# Patient Record
Sex: Female | Born: 1980 | Race: White | Hispanic: No | Marital: Married | Smoking: Never smoker
Health system: Southern US, Community
[De-identification: ages and names within clinical notes are randomized; demographics above are authoritative.]

## PROBLEM LIST (undated history)

## (undated) DIAGNOSIS — E786 Lipoprotein deficiency: Secondary | ICD-10-CM

## (undated) DIAGNOSIS — R7303 Prediabetes: Secondary | ICD-10-CM

## (undated) DIAGNOSIS — E559 Vitamin D deficiency, unspecified: Secondary | ICD-10-CM

## (undated) DIAGNOSIS — E7800 Pure hypercholesterolemia, unspecified: Secondary | ICD-10-CM

## (undated) DIAGNOSIS — R0602 Shortness of breath: Secondary | ICD-10-CM

## (undated) DIAGNOSIS — Z62819 Personal history of unspecified abuse in childhood: Secondary | ICD-10-CM

## (undated) DIAGNOSIS — M545 Low back pain, unspecified: Secondary | ICD-10-CM

## (undated) DIAGNOSIS — E538 Deficiency of other specified B group vitamins: Secondary | ICD-10-CM

## (undated) DIAGNOSIS — E162 Hypoglycemia, unspecified: Secondary | ICD-10-CM

## (undated) DIAGNOSIS — G4700 Insomnia, unspecified: Secondary | ICD-10-CM

## (undated) DIAGNOSIS — G43909 Migraine, unspecified, not intractable, without status migrainosus: Secondary | ICD-10-CM

## (undated) DIAGNOSIS — R61 Generalized hyperhidrosis: Secondary | ICD-10-CM

## (undated) DIAGNOSIS — E039 Hypothyroidism, unspecified: Secondary | ICD-10-CM

## (undated) DIAGNOSIS — M797 Fibromyalgia: Secondary | ICD-10-CM

## (undated) DIAGNOSIS — R1310 Dysphagia, unspecified: Secondary | ICD-10-CM

## (undated) DIAGNOSIS — N730 Acute parametritis and pelvic cellulitis: Secondary | ICD-10-CM

## (undated) DIAGNOSIS — N3289 Other specified disorders of bladder: Secondary | ICD-10-CM

## (undated) DIAGNOSIS — R079 Chest pain, unspecified: Secondary | ICD-10-CM

## (undated) DIAGNOSIS — R32 Unspecified urinary incontinence: Secondary | ICD-10-CM

## (undated) DIAGNOSIS — M62830 Muscle spasm of back: Secondary | ICD-10-CM

## (undated) DIAGNOSIS — F419 Anxiety disorder, unspecified: Secondary | ICD-10-CM

## (undated) DIAGNOSIS — R531 Weakness: Secondary | ICD-10-CM

## (undated) DIAGNOSIS — T7840XA Allergy, unspecified, initial encounter: Secondary | ICD-10-CM

## (undated) DIAGNOSIS — F319 Bipolar disorder, unspecified: Secondary | ICD-10-CM

## (undated) DIAGNOSIS — M2550 Pain in unspecified joint: Secondary | ICD-10-CM

## (undated) DIAGNOSIS — IMO0001 Reserved for inherently not codable concepts without codable children: Secondary | ICD-10-CM

## (undated) DIAGNOSIS — M549 Dorsalgia, unspecified: Secondary | ICD-10-CM

## (undated) DIAGNOSIS — M7989 Other specified soft tissue disorders: Secondary | ICD-10-CM

## (undated) DIAGNOSIS — R5382 Chronic fatigue, unspecified: Secondary | ICD-10-CM

## (undated) DIAGNOSIS — R202 Paresthesia of skin: Secondary | ICD-10-CM

## (undated) DIAGNOSIS — K760 Fatty (change of) liver, not elsewhere classified: Secondary | ICD-10-CM

## (undated) DIAGNOSIS — R51 Headache: Secondary | ICD-10-CM

## (undated) DIAGNOSIS — N979 Female infertility, unspecified: Secondary | ICD-10-CM

## (undated) DIAGNOSIS — G4733 Obstructive sleep apnea (adult) (pediatric): Secondary | ICD-10-CM

## (undated) DIAGNOSIS — M940 Chondrocostal junction syndrome [Tietze]: Secondary | ICD-10-CM

## (undated) DIAGNOSIS — K589 Irritable bowel syndrome without diarrhea: Secondary | ICD-10-CM

## (undated) DIAGNOSIS — N390 Urinary tract infection, site not specified: Secondary | ICD-10-CM

## (undated) DIAGNOSIS — G4730 Sleep apnea, unspecified: Secondary | ICD-10-CM

## (undated) DIAGNOSIS — K5900 Constipation, unspecified: Secondary | ICD-10-CM

## (undated) DIAGNOSIS — E079 Disorder of thyroid, unspecified: Secondary | ICD-10-CM

## (undated) DIAGNOSIS — R413 Other amnesia: Secondary | ICD-10-CM

## (undated) DIAGNOSIS — R5383 Other fatigue: Secondary | ICD-10-CM

## (undated) DIAGNOSIS — G9332 Myalgic encephalomyelitis/chronic fatigue syndrome: Secondary | ICD-10-CM

## (undated) DIAGNOSIS — E282 Polycystic ovarian syndrome: Secondary | ICD-10-CM

## (undated) DIAGNOSIS — F32A Depression, unspecified: Secondary | ICD-10-CM

## (undated) DIAGNOSIS — Z8739 Personal history of other diseases of the musculoskeletal system and connective tissue: Secondary | ICD-10-CM

## (undated) DIAGNOSIS — K219 Gastro-esophageal reflux disease without esophagitis: Secondary | ICD-10-CM

## (undated) HISTORY — DX: Insomnia, unspecified: G47.00

## (undated) HISTORY — DX: Polycystic ovarian syndrome: E28.2

## (undated) HISTORY — DX: Pure hypercholesterolemia, unspecified: E78.00

## (undated) HISTORY — DX: Fatty (change of) liver, not elsewhere classified: K76.0

## (undated) HISTORY — DX: Obstructive sleep apnea (adult) (pediatric): G47.33

## (undated) HISTORY — DX: Disorder of thyroid, unspecified: E07.9

## (undated) HISTORY — DX: Other amnesia: R41.3

## (undated) HISTORY — DX: Chronic fatigue, unspecified: R53.82

## (undated) HISTORY — DX: Prediabetes: R73.03

## (undated) HISTORY — DX: Chondrocostal junction syndrome (tietze): M94.0

## (undated) HISTORY — DX: Weakness: R53.1

## (undated) HISTORY — DX: Female infertility, unspecified: N97.9

## (undated) HISTORY — DX: Vitamin D deficiency, unspecified: E55.9

## (undated) HISTORY — DX: Muscle spasm of back: M62.830

## (undated) HISTORY — DX: Pain in unspecified joint: M25.50

## (undated) HISTORY — DX: Depression, unspecified: F32.A

## (undated) HISTORY — DX: Generalized hyperhidrosis: R61

## (undated) HISTORY — DX: Fibromyalgia: M79.7

## (undated) HISTORY — DX: Personal history of unspecified abuse in childhood: Z62.819

## (undated) HISTORY — DX: Headache: R51

## (undated) HISTORY — DX: Migraine, unspecified, not intractable, without status migrainosus: G43.909

## (undated) HISTORY — DX: Anxiety disorder, unspecified: F41.9

## (undated) HISTORY — DX: Constipation, unspecified: K59.00

## (undated) HISTORY — DX: Allergy, unspecified, initial encounter: T78.40XA

## (undated) HISTORY — DX: Deficiency of other specified B group vitamins: E53.8

## (undated) HISTORY — DX: Hypothyroidism, unspecified: E03.9

## (undated) HISTORY — DX: Hypoglycemia, unspecified: E16.2

## (undated) HISTORY — DX: Irritable bowel syndrome, unspecified: K58.9

## (undated) HISTORY — DX: Sleep apnea, unspecified: G47.30

## (undated) HISTORY — DX: Dysphagia, unspecified: R13.10

## (undated) HISTORY — DX: Other fatigue: R53.83

## (undated) HISTORY — DX: Chest pain, unspecified: R07.9

## (undated) HISTORY — DX: Dorsalgia, unspecified: M54.9

## (undated) HISTORY — DX: Lipoprotein deficiency: E78.6

## (undated) HISTORY — DX: Personal history of other diseases of the musculoskeletal system and connective tissue: Z87.39

## (undated) HISTORY — DX: Other specified soft tissue disorders: M79.89

## (undated) HISTORY — DX: Myalgic encephalomyelitis/chronic fatigue syndrome: G93.32

## (undated) HISTORY — DX: Gastro-esophageal reflux disease without esophagitis: K21.9

## (undated) HISTORY — DX: Other specified disorders of bladder: N32.89

## (undated) HISTORY — DX: Low back pain, unspecified: M54.50

## (undated) HISTORY — DX: Shortness of breath: R06.02

## (undated) HISTORY — DX: Paresthesia of skin: R20.2

## (undated) HISTORY — DX: Reserved for inherently not codable concepts without codable children: IMO0001

## (undated) HISTORY — DX: Low back pain: M54.5

## (undated) HISTORY — DX: Urinary tract infection, site not specified: N39.0

## (undated) HISTORY — DX: Bipolar disorder, unspecified: F31.9

## (undated) HISTORY — DX: Acute parametritis and pelvic cellulitis: N73.0

## (undated) HISTORY — DX: Unspecified urinary incontinence: R32

---
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
## 2006-12-28 ENCOUNTER — Emergency Department (HOSPITAL_COMMUNITY): Admission: EM | Admit: 2006-12-28 | Discharge: 2006-12-28 | Payer: Self-pay | Admitting: Emergency Medicine

## 2006-12-31 ENCOUNTER — Emergency Department (HOSPITAL_COMMUNITY): Admission: EM | Admit: 2006-12-31 | Discharge: 2006-12-31 | Payer: Self-pay | Admitting: Emergency Medicine

## 2007-01-20 ENCOUNTER — Emergency Department (HOSPITAL_COMMUNITY): Admission: EM | Admit: 2007-01-20 | Discharge: 2007-01-20 | Payer: Self-pay | Admitting: Emergency Medicine

## 2007-02-11 ENCOUNTER — Emergency Department (HOSPITAL_COMMUNITY): Admission: EM | Admit: 2007-02-11 | Discharge: 2007-02-11 | Payer: Self-pay | Admitting: Emergency Medicine

## 2007-02-23 ENCOUNTER — Ambulatory Visit (HOSPITAL_COMMUNITY): Admission: RE | Admit: 2007-02-23 | Discharge: 2007-02-23 | Payer: Self-pay | Admitting: Nurse Practitioner

## 2007-02-23 ENCOUNTER — Encounter (INDEPENDENT_AMBULATORY_CARE_PROVIDER_SITE_OTHER): Payer: Self-pay | Admitting: Nurse Practitioner

## 2007-02-23 ENCOUNTER — Ambulatory Visit: Payer: Self-pay | Admitting: Family Medicine

## 2007-02-23 LAB — CONVERTED CEMR LAB
ALT: 13 units/L (ref 0–35)
AST: 13 units/L (ref 0–37)
Alkaline Phosphatase: 63 units/L (ref 39–117)
BUN: 11 mg/dL (ref 6–23)
CO2: 21 meq/L (ref 19–32)
Creatinine, Ser: 0.74 mg/dL (ref 0.40–1.20)
Eosinophils Relative: 1 % (ref 0–5)
MCV: 90.9 fL (ref 78.0–100.0)
Monocytes Absolute: 0.6 10*3/uL (ref 0.2–0.7)
Platelets: 200 10*3/uL (ref 150–400)
Potassium: 4 meq/L (ref 3.5–5.3)
RDW: 15.2 % — ABNORMAL HIGH (ref 11.5–14.0)
Sodium: 140 meq/L (ref 135–145)
Total Bilirubin: 0.2 mg/dL — ABNORMAL LOW (ref 0.3–1.2)
Total Protein: 7.8 g/dL (ref 6.0–8.3)
WBC: 7.6 10*3/uL (ref 4.0–10.5)

## 2007-02-24 ENCOUNTER — Ambulatory Visit: Payer: Self-pay | Admitting: *Deleted

## 2007-03-02 ENCOUNTER — Emergency Department (HOSPITAL_COMMUNITY): Admission: EM | Admit: 2007-03-02 | Discharge: 2007-03-03 | Payer: Self-pay | Admitting: Emergency Medicine

## 2007-03-03 ENCOUNTER — Ambulatory Visit: Payer: Self-pay | Admitting: Internal Medicine

## 2007-03-03 LAB — CONVERTED CEMR LAB: TSH: 2.141 microintl units/mL (ref 0.350–5.50)

## 2007-03-07 ENCOUNTER — Emergency Department (HOSPITAL_COMMUNITY): Admission: EM | Admit: 2007-03-07 | Discharge: 2007-03-07 | Payer: Self-pay | Admitting: Emergency Medicine

## 2007-03-11 ENCOUNTER — Emergency Department (HOSPITAL_COMMUNITY): Admission: EM | Admit: 2007-03-11 | Discharge: 2007-03-11 | Payer: Self-pay | Admitting: *Deleted

## 2007-03-17 ENCOUNTER — Ambulatory Visit: Payer: Self-pay | Admitting: Family Medicine

## 2007-03-17 ENCOUNTER — Encounter (INDEPENDENT_AMBULATORY_CARE_PROVIDER_SITE_OTHER): Payer: Self-pay | Admitting: Nurse Practitioner

## 2007-03-18 ENCOUNTER — Encounter (INDEPENDENT_AMBULATORY_CARE_PROVIDER_SITE_OTHER): Payer: Self-pay | Admitting: Nurse Practitioner

## 2007-05-04 ENCOUNTER — Other Ambulatory Visit: Admission: RE | Admit: 2007-05-04 | Discharge: 2007-05-04 | Payer: Self-pay | Admitting: Obstetrics and Gynecology

## 2007-05-04 ENCOUNTER — Ambulatory Visit: Payer: Self-pay | Admitting: Obstetrics and Gynecology

## 2007-05-26 HISTORY — PX: MULTIPLE TOOTH EXTRACTIONS: SHX2053

## 2007-06-02 ENCOUNTER — Ambulatory Visit: Payer: Self-pay | Admitting: Obstetrics & Gynecology

## 2007-08-28 ENCOUNTER — Emergency Department (HOSPITAL_COMMUNITY): Admission: EM | Admit: 2007-08-28 | Discharge: 2007-08-28 | Payer: Self-pay | Admitting: Emergency Medicine

## 2007-10-10 ENCOUNTER — Emergency Department (HOSPITAL_COMMUNITY): Admission: EM | Admit: 2007-10-10 | Discharge: 2007-10-10 | Payer: Self-pay | Admitting: Emergency Medicine
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
## 2007-11-09 ENCOUNTER — Encounter: Admission: RE | Admit: 2007-11-09 | Discharge: 2007-11-09 | Payer: Self-pay | Admitting: Specialist

## 2008-07-27 ENCOUNTER — Emergency Department (HOSPITAL_COMMUNITY): Admission: EM | Admit: 2008-07-27 | Discharge: 2008-07-27 | Payer: Self-pay | Admitting: Emergency Medicine

## 2008-08-08 ENCOUNTER — Ambulatory Visit: Payer: Self-pay | Admitting: Psychiatry

## 2008-08-08 ENCOUNTER — Inpatient Hospital Stay (HOSPITAL_COMMUNITY): Admission: RE | Admit: 2008-08-08 | Discharge: 2008-08-10 | Payer: Self-pay | Admitting: Psychiatry

## 2009-02-24 ENCOUNTER — Emergency Department (HOSPITAL_COMMUNITY): Admission: EM | Admit: 2009-02-24 | Discharge: 2009-02-24 | Payer: Self-pay | Admitting: Family Medicine

## 2010-08-26 ENCOUNTER — Other Ambulatory Visit: Payer: Self-pay | Admitting: Family Medicine

## 2010-08-26 DIAGNOSIS — R7989 Other specified abnormal findings of blood chemistry: Secondary | ICD-10-CM

## 2010-08-29 ENCOUNTER — Ambulatory Visit
Admission: RE | Admit: 2010-08-29 | Discharge: 2010-08-29 | Disposition: A | Discharge status: Home / Self Care | Payer: 59 | Source: Ambulatory Visit | Attending: Family Medicine | Admitting: Family Medicine

## 2010-08-29 DIAGNOSIS — R7989 Other specified abnormal findings of blood chemistry: Secondary | ICD-10-CM

## 2010-08-29 MED ORDER — GADOBENATE DIMEGLUMINE 529 MG/ML IV SOLN
15.0000 mL | Freq: Once | INTRAVENOUS | Status: AC | PRN
  Administered 2010-08-29: 15 mL via INTRAVENOUS

## 2010-09-04 LAB — COMPREHENSIVE METABOLIC PANEL
ALT: 33 U/L (ref 0–35)
Alkaline Phosphatase: 66 U/L (ref 39–117)
BUN: 8 mg/dL (ref 6–23)
Calcium: 9.4 mg/dL (ref 8.4–10.5)
Chloride: 105 mEq/L (ref 96–112)
GFR calc non Af Amer: >60 mL/min (ref >60)
Sodium: 140 mEq/L (ref 135–145)
Total Bilirubin: 0.8 mg/dL (ref 0.3–1.2)

## 2010-09-04 LAB — CBC
HCT: 39.4 % (ref 36.0–46.0)
Hemoglobin: 13.3 g/dL (ref 12.0–15.0)
RBC: 4.52 MIL/uL (ref 3.87–5.11)

## 2010-09-04 LAB — URINE DRUGS OF ABUSE SCREEN W ALC, ROUTINE (REF LAB)
Amphetamine Screen, Ur: NEGATIVE
Barbiturate Quant, Ur: NEGATIVE
Cocaine Metabolites: NEGATIVE
Methadone: NEGATIVE
Opiate Screen, Urine: NEGATIVE
Propoxyphene: NEGATIVE

## 2010-10-07 NOTE — H&P (Signed)
Nancy [name], ION NO.:  [id number]   MEDICAL RECORD NO.:  [id number]          PATIENT TYPE:  IPS   LOCATION:  0508                          FACILITY:  [facility]   PHYSICIAN:  Geoffery Lyons, M.D.      DATE OF BIRTH:  March 12, 1981   DATE OF ADMISSION:  08/08/2008  DATE OF DISCHARGE:                       PSYCHIATRIC ADMISSION ASSESSMENT   A 30 year old female, voluntarily admitted August 08, 2008.   HISTORY OF PRESENT ILLNESS:  The patient reports a history of  depression, which she is finding very severe, experiencing mood swings,  having problems with no energy, decreased motivation.  Sleep has been  increased.  Appetite has been increased when she is especially having  low mood.  She states her mood changes daily with no specific stressors.  She has been having suicidal thoughts with plans to overdose or cut her  wrist.  She can identify no specific triggers, stating that she feels  like she has a good life.  She, in the past, has history of rages at  times.  She denies any psychotic symptoms and denies any history of any  substance use, has been compliant with her medications.   PAST PSYCHIATRIC HISTORY:  First admission to [facility].  Sees Dr. Ilsa Iha for outpatient mental health services and has a  therapist named Zenia Resides.  The patient reports being on Zoloft in the  past but feels it stopped working.  Also treated as PTSD.  She has tried  to harm herself three times the past by overdosing, once on pain pills  and two other polysubstance overdoses with one of the overdoses  requiring an inpatient admission.   SOCIAL HISTORY:  She is a married, female has been married since 2008.  She reports a supportive husband.  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  She has no children.   FAMILY HISTORY:  On her paternal side, mood disorders and a sister who  is bipolar.   ALCOHOL OR DRUG HISTORY:  Denies.   PRIMARY CARE  Reuven Braver:  Dr. Wynelle Link, who is her primary care Sharvi Mooneyhan.   MEDICAL PROBLEMS:  She reports history of occasional asthma symptoms.   MEDICATIONS:  1. Currently taking Lexapro 20 mg daily.  2. Vistaril 25 mg on a p.r.n. basis, but finds herself getting very      mean with the medications.   DRUG ALLERGIES:  She has no known allergies.   REVIEW OF SYSTEMS:  Positive for increased appetite with increased  weight gain, positive for insomnia, positive for mood changes and  occasional shortness of breath.  No chest pain.  No nausea, vomiting,  dysuria, no seizures.  No falls.  Positive for depression and suicidal  thoughts.   PHYSICAL EXAM:  VITAL SIGNS:  Temperature of 98.6, 77 heart rate, 18  respirations, blood pressure is 117/75, 5 feet 4 inches tall, 231  pounds.  GENERAL:  This is an obese female.  She is young, in no acute distress.  Denies any complaints.  HEENT:  Head is atraumatic.  EOMs are intact.  NECK:  Negative lymphadenopathy.  CHEST:  Clear, no wheezes.  No rales.  BREASTS:  Exam was deferred.  HEART:  Regular rate and rhythm.  No murmurs or gallops.  ABDOMEN:  Soft, nontender, nondistended abdomen.  PELVIC AND GU EXAM:  Deferred.  EXTREMITIES:  Moves all extremities.  No clubbing, no deformities.  Strong at 5+ against resistance.  SKIN:  Warm and dry.  No rashes.  The nursing skin assessment did show  some scratches from working outside along the upper chest and also  multiple nicks on the chest and arms.  NEUROLOGIC:  Findings are intact.  Cranial nerves II-XII are intact.   LABORATORY DATA:  Shows urine drug screen is negative.  CBC within  normal limits.  TSH is 4.080, glucose of 139.   MENTAL STATUS EXAM:  The patient is fully alert and cooperative,  casually dressed, good eye-contact.  Her speech is clear, normal pace  and tone, articulate.  She provides a good history on her symptoms,  knowledgeable about her medications.  Her thought processes are coherent   and goal directed.  No suicidal thoughts at this time.  Feels safe.  Her  cognitive function is intact.  Her memory is good.  Judgment is good.  Her insight appears to be good.  She appears sincere.   AXIS I:  Bipolar disorder, depressed.  AXIS II:  Deferred.  AXIS III:  Occasional respiratory symptoms.  AXIS IV:  Psychosocial problems with her burden of illness.  AXIS V:  Current is 35-40.   OUR PLAN:  To contract for safety.  We will discontinue her Lexapro and  initiate Lamictal as a mood stabilizer with the risks and benefits  discussed with the patient.  We will also augment with some Abilify at 2  mg on a b.i.d. basis, which was discussed with the patient.  The patient  is agreeable to her medication changes.  We will put the patient in the  blue group.  Will also consider family session with her husband for  support.  The patient will need to continue with her therapy sessions  and follow-up.  Tentative length of stay is 3-4 days.      Landry Corporal, N.P.      Geoffery Lyons, M.D.  Electronically Signed    JO/MEDQ  D:  08/10/2008  T:  08/10/2008  Job:  [id number]
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
## 2011-03-05 LAB — DIFFERENTIAL
Eosinophils Absolute: 0.1
Eosinophils Relative: 1
Eosinophils Relative: 1
Eosinophils Relative: 1
Lymphocytes Relative: 17
Lymphocytes Relative: 15
Lymphs Abs: 1.8
Lymphs Abs: 1.7
Monocytes Absolute: 0.5
Monocytes Absolute: 0.7
Monocytes Relative: 7
Monocytes Relative: 5
Monocytes Relative: 8
Neutro Abs: 7.4
Neutro Abs: 6.4

## 2011-03-05 LAB — RAPID URINE DRUG SCREEN, HOSP PERFORMED
Amphetamines: NOT DETECTED
Barbiturates: NOT DETECTED

## 2011-03-05 LAB — COMPREHENSIVE METABOLIC PANEL
ALT: 17
AST: 17
AST: 18
Albumin: 3.6
Albumin: 3.6
BUN: 8
Calcium: 9.1
Chloride: 102
Creatinine, Ser: 0.74
GFR calc Af Amer: >60
GFR calc Af Amer: >60
Potassium: 3.8
Sodium: 137
Total Protein: 7.9
Total Protein: 7.3

## 2011-03-05 LAB — CBC
HCT: 37.2
HCT: 38.9
Hemoglobin: 13.5
Hemoglobin: 13
MCHC: 34
MCV: 85.7
Platelets: 211
RBC: 4.55
RBC: 4.34
RDW: 14.2 — ABNORMAL HIGH
RDW: 14.5 — ABNORMAL HIGH
RDW: 14.4 — ABNORMAL HIGH
WBC: 10.1
WBC: 9.7
WBC: 8.5

## 2011-03-05 LAB — BASIC METABOLIC PANEL
Calcium: 9.4
GFR calc Af Amer: >60
GFR calc non Af Amer: >60
Glucose, Bld: 89
Potassium: 4
Sodium: 137

## 2011-03-05 LAB — ETHANOL: Alcohol, Ethyl (B): <5

## 2011-03-05 LAB — URINE CULTURE: Colony Count: >=100000

## 2011-03-05 LAB — URINALYSIS, ROUTINE W REFLEX MICROSCOPIC
Bilirubin Urine: NEGATIVE
Glucose, UA: NEGATIVE
Glucose, UA: NEGATIVE
Ketones, ur: NEGATIVE
Ketones, ur: NEGATIVE
Protein, ur: NEGATIVE
Specific Gravity, Urine: 1.027
Urobilinogen, UA: 0.2
pH: 6

## 2011-03-05 LAB — PREGNANCY, URINE: Preg Test, Ur: NEGATIVE

## 2011-03-05 LAB — POCT CARDIAC MARKERS
CKMB, poc: <1 — ABNORMAL LOW
Myoglobin, poc: 42.8
Troponin i, poc: <0.05

## 2011-03-06 LAB — URINALYSIS, ROUTINE W REFLEX MICROSCOPIC
Bilirubin Urine: NEGATIVE
Glucose, UA: NEGATIVE
Hgb urine dipstick: NEGATIVE
Specific Gravity, Urine: 1.027
pH: 7.5

## 2011-03-06 LAB — I-STAT 8, (EC8 V) (CONVERTED LAB)
BUN: 12
Bicarbonate: 26.2 — ABNORMAL HIGH
Glucose, Bld: 98
Hemoglobin: 15.3 — ABNORMAL HIGH
Sodium: 138
pH, Ven: 7.4 — ABNORMAL HIGH

## 2011-03-06 LAB — CBC
HCT: 41.7
Hemoglobin: 14.2
MCV: 86.7
RBC: 4.8
WBC: 8.6

## 2011-03-06 LAB — DIFFERENTIAL
Eosinophils Absolute: 0.1
Eosinophils Relative: 1
Lymphs Abs: 1.4
Monocytes Absolute: 0.5
Monocytes Relative: 6
Neutrophils Relative %: 78 — ABNORMAL HIGH

## 2011-03-06 LAB — POCT I-STAT CREATININE: Creatinine, Ser: 0.9

## 2011-03-06 LAB — POCT PREGNANCY, URINE: Preg Test, Ur: NEGATIVE

## 2011-03-06 LAB — D-DIMER, QUANTITATIVE: D-Dimer, Quant: 0.43

## 2011-03-09 LAB — URINALYSIS, ROUTINE W REFLEX MICROSCOPIC
Glucose, UA: NEGATIVE
Ketones, ur: 15 — AB
pH: 5.5

## 2011-03-09 LAB — URINE MICROSCOPIC-ADD ON

## 2011-03-09 LAB — I-STAT 8, (EC8 V) (CONVERTED LAB)
Acid-base deficit: 1
Chloride: 108
Hemoglobin: 13.9
Potassium: 3.6
Sodium: 143
TCO2: 25

## 2011-03-09 LAB — POCT I-STAT CREATININE
Creatinine, Ser: 0.9
Operator id: 198171

## 2011-03-09 LAB — D-DIMER, QUANTITATIVE: D-Dimer, Quant: 0.31

## 2011-08-31 ENCOUNTER — Encounter (HOSPITAL_COMMUNITY): Payer: Self-pay | Admitting: Psychiatry

## 2011-08-31 ENCOUNTER — Ambulatory Visit (INDEPENDENT_AMBULATORY_CARE_PROVIDER_SITE_OTHER): Payer: 59 | Admitting: Psychiatry

## 2011-08-31 VITALS — BP 132/75 | HR 61 | Ht 64.0 in | Wt 235.0 lb

## 2011-08-31 DIAGNOSIS — F319 Bipolar disorder, unspecified: Secondary | ICD-10-CM
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
## 2011-09-30 ENCOUNTER — Ambulatory Visit (HOSPITAL_COMMUNITY): Payer: 59 | Admitting: Psychiatry

## 2011-11-04 ENCOUNTER — Other Ambulatory Visit: Payer: Self-pay | Admitting: Family Medicine

## 2011-11-04 ENCOUNTER — Ambulatory Visit
Admission: RE | Admit: 2011-11-04 | Discharge: 2011-11-04 | Disposition: A | Discharge status: Home / Self Care | Payer: 59 | Source: Ambulatory Visit | Attending: Family Medicine | Admitting: Family Medicine

## 2011-11-04 DIAGNOSIS — M25569 Pain in unspecified knee: Secondary | ICD-10-CM

## 2012-04-15 ENCOUNTER — Other Ambulatory Visit: Payer: Self-pay | Admitting: Rheumatology

## 2012-04-15 ENCOUNTER — Ambulatory Visit
Admission: RE | Admit: 2012-04-15 | Discharge: 2012-04-15 | Disposition: A | Discharge status: Home / Self Care | Payer: 59 | Source: Ambulatory Visit | Attending: Rheumatology | Admitting: Rheumatology

## 2012-04-15 DIAGNOSIS — R0602 Shortness of breath: Secondary | ICD-10-CM

## 2012-04-15 DIAGNOSIS — M2550 Pain in unspecified joint: Secondary | ICD-10-CM

## 2012-05-10 ENCOUNTER — Other Ambulatory Visit: Payer: Self-pay | Admitting: Rheumatology

## 2012-05-10 DIAGNOSIS — M79642 Pain in left hand: Secondary | ICD-10-CM

## 2012-05-10 DIAGNOSIS — M25532 Pain in left wrist: Secondary | ICD-10-CM

## 2012-05-16 ENCOUNTER — Other Ambulatory Visit: Payer: 59

## 2012-05-16 ENCOUNTER — Ambulatory Visit
Admission: RE | Admit: 2012-05-16 | Discharge: 2012-05-16 | Disposition: A | Discharge status: Home / Self Care | Payer: 59 | Source: Ambulatory Visit | Attending: Rheumatology | Admitting: Rheumatology

## 2012-05-16 DIAGNOSIS — M79642 Pain in left hand: Secondary | ICD-10-CM

## 2012-05-16 DIAGNOSIS — M25532 Pain in left wrist: Secondary | ICD-10-CM

## 2012-05-16 MED ORDER — GADOBENATE DIMEGLUMINE 529 MG/ML IV SOLN
20.0000 mL | Freq: Once | INTRAVENOUS | Status: AC | PRN
  Administered 2012-05-16: 20 mL via INTRAVENOUS

## 2012-10-21 ENCOUNTER — Other Ambulatory Visit (HOSPITAL_COMMUNITY)
Admission: RE | Admit: 2012-10-21 | Discharge: 2012-10-21 | Disposition: A | Discharge status: Home / Self Care | Payer: 59 | Source: Ambulatory Visit | Attending: Family Medicine | Admitting: Family Medicine

## 2012-10-21 ENCOUNTER — Other Ambulatory Visit: Payer: Self-pay | Admitting: Family Medicine

## 2012-10-21 DIAGNOSIS — Z01419 Encounter for gynecological examination (general) (routine) without abnormal findings: Secondary | ICD-10-CM | POA: Insufficient documentation

## 2012-12-28 ENCOUNTER — Other Ambulatory Visit: Payer: Self-pay | Admitting: Gastroenterology

## 2012-12-28 DIAGNOSIS — R0789 Other chest pain: Secondary | ICD-10-CM

## 2012-12-30 ENCOUNTER — Ambulatory Visit
Admission: RE | Admit: 2012-12-30 | Discharge: 2012-12-30 | Disposition: A | Discharge status: Home / Self Care | Payer: 59 | Source: Ambulatory Visit | Attending: Gastroenterology | Admitting: Gastroenterology

## 2012-12-30 ENCOUNTER — Other Ambulatory Visit: Payer: 59

## 2012-12-30 DIAGNOSIS — R0789 Other chest pain: Secondary | ICD-10-CM

## 2013-01-03 ENCOUNTER — Other Ambulatory Visit: Payer: Self-pay | Admitting: Gastroenterology

## 2013-01-03 DIAGNOSIS — R109 Unspecified abdominal pain: Secondary | ICD-10-CM

## 2013-01-05 ENCOUNTER — Ambulatory Visit
Admission: RE | Admit: 2013-01-05 | Discharge: 2013-01-05 | Disposition: A | Discharge status: Home / Self Care | Payer: 59 | Source: Ambulatory Visit | Attending: Gastroenterology | Admitting: Gastroenterology

## 2013-01-05 DIAGNOSIS — R109 Unspecified abdominal pain: Secondary | ICD-10-CM

## 2013-01-05 MED ORDER — IOHEXOL 300 MG/ML  SOLN
125.0000 mL | Freq: Once | INTRAMUSCULAR | Status: AC | PRN
  Administered 2013-01-05: 125 mL via INTRAVENOUS

## 2013-01-05 MED ORDER — IOHEXOL 300 MG/ML  SOLN
30.0000 mL | Freq: Once | INTRAMUSCULAR | Status: AC | PRN
  Administered 2013-01-05: 30 mL via ORAL

## 2013-07-28 ENCOUNTER — Encounter: Payer: Self-pay | Admitting: *Deleted

## 2013-08-04 ENCOUNTER — Ambulatory Visit: Payer: 59 | Admitting: Neurology

## 2013-11-09 ENCOUNTER — Encounter: Payer: Self-pay | Admitting: *Deleted

## 2014-12-28 ENCOUNTER — Other Ambulatory Visit: Payer: Self-pay | Admitting: Family Medicine

## 2014-12-28 ENCOUNTER — Other Ambulatory Visit (HOSPITAL_COMMUNITY)
Admission: RE | Admit: 2014-12-28 | Discharge: 2014-12-28 | Disposition: A | Discharge status: Home / Self Care | Payer: No Typology Code available for payment source | Source: Ambulatory Visit | Attending: Family Medicine | Admitting: Family Medicine

## 2014-12-28 DIAGNOSIS — Z01411 Encounter for gynecological examination (general) (routine) with abnormal findings: Secondary | ICD-10-CM | POA: Insufficient documentation

## 2014-12-31 LAB — CYTOLOGY - PAP

## 2016-07-20 ENCOUNTER — Other Ambulatory Visit (HOSPITAL_COMMUNITY)
Admission: RE | Admit: 2016-07-20 | Discharge: 2016-07-20 | Disposition: A | Discharge status: Home / Self Care | Payer: BLUE CROSS/BLUE SHIELD | Source: Ambulatory Visit | Attending: Family Medicine | Admitting: Family Medicine

## 2016-07-20 ENCOUNTER — Other Ambulatory Visit: Payer: Self-pay | Admitting: Family Medicine

## 2016-07-20 DIAGNOSIS — Z01411 Encounter for gynecological examination (general) (routine) with abnormal findings: Secondary | ICD-10-CM | POA: Insufficient documentation

## 2016-07-22 LAB — CYTOLOGY - PAP: DIAGNOSIS: NEGATIVE

## 2017-03-02 ENCOUNTER — Other Ambulatory Visit: Payer: Self-pay | Admitting: Family Medicine

## 2017-03-02 DIAGNOSIS — R202 Paresthesia of skin: Secondary | ICD-10-CM

## 2017-03-02 DIAGNOSIS — M542 Cervicalgia: Secondary | ICD-10-CM

## 2017-03-12 ENCOUNTER — Ambulatory Visit
Admission: RE | Admit: 2017-03-12 | Discharge: 2017-03-12 | Disposition: A | Discharge status: Home / Self Care | Payer: BLUE CROSS/BLUE SHIELD | Source: Ambulatory Visit | Attending: Family Medicine | Admitting: Family Medicine

## 2017-03-12 DIAGNOSIS — R202 Paresthesia of skin: Secondary | ICD-10-CM

## 2017-03-12 DIAGNOSIS — M542 Cervicalgia: Secondary | ICD-10-CM

## 2017-03-25 HISTORY — PX: NECK SURGERY: SHX720

## 2018-02-01 ENCOUNTER — Encounter: Payer: Self-pay | Admitting: *Deleted

## 2018-02-02 ENCOUNTER — Encounter

## 2018-02-02 ENCOUNTER — Telehealth: Payer: Self-pay | Admitting: Neurology

## 2018-02-02 ENCOUNTER — Encounter: Payer: Self-pay | Admitting: Neurology

## 2018-02-02 ENCOUNTER — Ambulatory Visit (INDEPENDENT_AMBULATORY_CARE_PROVIDER_SITE_OTHER): Payer: BLUE CROSS/BLUE SHIELD | Admitting: Neurology

## 2018-02-02 VITALS — BP 134/83 | HR 84 | Ht 64.0 in | Wt 239.8 lb

## 2018-02-02 DIAGNOSIS — M791 Myalgia, unspecified site: Secondary | ICD-10-CM | POA: Diagnosis not present

## 2018-02-02 DIAGNOSIS — G3281 Cerebellar ataxia in diseases classified elsewhere: Secondary | ICD-10-CM | POA: Diagnosis not present

## 2018-02-02 DIAGNOSIS — R413 Other amnesia: Secondary | ICD-10-CM

## 2018-02-02 DIAGNOSIS — R269 Unspecified abnormalities of gait and mobility: Secondary | ICD-10-CM

## 2018-02-02 NOTE — Telephone Encounter (Signed)
[employer] Auth: [id number] (exp. 02/02/18 to 03/03/18) order sent to [facility]. They will reach out to the pt to schedule.
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
## 2018-02-03 ENCOUNTER — Telehealth: Payer: Self-pay | Admitting: Neurology

## 2018-02-03 NOTE — Telephone Encounter (Signed)
The order was sent to [facility] she is scheduled for 02/08/18 at [facility].

## 2018-02-03 NOTE — Telephone Encounter (Signed)
Pt rec'd a call to schedule MRI of cervical. She said Dr Krista Blue was to put in order for MRI brain also. Please call to advise

## 2018-02-04 ENCOUNTER — Telehealth: Payer: Self-pay | Admitting: Neurology

## 2018-02-04 LAB — VITAMIN B12: Vitamin B-12: 893 pg/mL (ref 232–1245)

## 2018-02-04 LAB — C-REACTIVE PROTEIN: CRP: 7 mg/L (ref 0–10)

## 2018-02-04 LAB — COPPER, SERUM: Copper: 155 ug/dL (ref 72–166)

## 2018-02-04 LAB — RPR: RPR Ser Ql: NONREACTIVE

## 2018-02-04 LAB — SEDIMENTATION RATE: Sed Rate: 13 mm/hr (ref 0–32)

## 2018-02-04 LAB — ANA W/REFLEX: Anti Nuclear Antibody(ANA): NEGATIVE

## 2018-02-04 LAB — ACETYLCHOLINE RECEPTOR, BINDING: AChR Binding Ab, Serum: <0.03 nmol/L (ref 0.00–0.24)

## 2018-02-04 LAB — CK: Total CK: 241 U/L — ABNORMAL HIGH (ref 24–173)

## 2018-02-04 NOTE — Telephone Encounter (Signed)
Spoke with pt. She sts. she thought Dr. Krista Blue was also going to order an MRI brain, due to increased problems with memory.  I will check with Dr. Krista Blue and call pt. back/fim

## 2018-02-04 NOTE — Telephone Encounter (Signed)
Pt has called stating that she called for Dr Krista Blue on yesterday and has not heard back yet.  Pt is asking for a call back please

## 2018-02-04 NOTE — Telephone Encounter (Signed)
Spoke with pt. and reviewed below lab results. She verbalized understanding of same/fim 

## 2018-02-04 NOTE — Telephone Encounter (Signed)
Yes, MRI brain and cervical orders were placed

## 2018-02-04 NOTE — Telephone Encounter (Signed)
Please call patient, extensive laboratory evaluation showed mild elevated CPK 241, which has unknown clinical significance, rest of the laboratory evaluations were normal,

## 2018-02-07 NOTE — Telephone Encounter (Signed)
Noted  

## 2018-02-07 NOTE — Telephone Encounter (Signed)
[employer] Auth: [id number] (exp. 02/07/18 to 03/08/18) order sent to GI.

## 2018-02-07 NOTE — Telephone Encounter (Signed)
LMOM that Dr. Krista Blue added the MRI brain, and I will check with Raquel Sarna to see if both of these can be done at the same time./fim

## 2018-02-08 ENCOUNTER — Other Ambulatory Visit: Payer: BLUE CROSS/BLUE SHIELD

## 2018-02-17 ENCOUNTER — Ambulatory Visit
Admission: RE | Admit: 2018-02-17 | Discharge: 2018-02-17 | Disposition: A | Discharge status: Home / Self Care | Payer: BLUE CROSS/BLUE SHIELD | Source: Ambulatory Visit | Attending: Neurology | Admitting: Neurology

## 2018-02-17 DIAGNOSIS — R413 Other amnesia: Secondary | ICD-10-CM

## 2018-02-17 DIAGNOSIS — G3281 Cerebellar ataxia in diseases classified elsewhere: Secondary | ICD-10-CM

## 2018-03-22 ENCOUNTER — Encounter (INDEPENDENT_AMBULATORY_CARE_PROVIDER_SITE_OTHER): Payer: BLUE CROSS/BLUE SHIELD

## 2018-03-25 ENCOUNTER — Encounter (INDEPENDENT_AMBULATORY_CARE_PROVIDER_SITE_OTHER): Payer: Self-pay | Admitting: Bariatrics

## 2018-03-28 ENCOUNTER — Encounter (INDEPENDENT_AMBULATORY_CARE_PROVIDER_SITE_OTHER): Payer: Self-pay | Admitting: Bariatrics

## 2018-03-29 ENCOUNTER — Encounter (INDEPENDENT_AMBULATORY_CARE_PROVIDER_SITE_OTHER): Payer: Self-pay

## 2018-03-29 ENCOUNTER — Ambulatory Visit (INDEPENDENT_AMBULATORY_CARE_PROVIDER_SITE_OTHER): Payer: BLUE CROSS/BLUE SHIELD | Admitting: Bariatrics

## 2018-04-12 ENCOUNTER — Ambulatory Visit (INDEPENDENT_AMBULATORY_CARE_PROVIDER_SITE_OTHER): Payer: BLUE CROSS/BLUE SHIELD | Admitting: Bariatrics

## 2018-04-27 ENCOUNTER — Ambulatory Visit (INDEPENDENT_AMBULATORY_CARE_PROVIDER_SITE_OTHER): Payer: BLUE CROSS/BLUE SHIELD | Admitting: Physician Assistant

## 2018-07-06 ENCOUNTER — Ambulatory Visit (INDEPENDENT_AMBULATORY_CARE_PROVIDER_SITE_OTHER): Payer: BLUE CROSS/BLUE SHIELD | Admitting: Neurology

## 2018-07-06 ENCOUNTER — Encounter: Payer: Self-pay | Admitting: Neurology

## 2018-07-06 VITALS — BP 130/90 | HR 86 | Ht 64.0 in | Wt 242.0 lb

## 2018-07-06 DIAGNOSIS — M791 Myalgia, unspecified site: Secondary | ICD-10-CM | POA: Diagnosis not present

## 2018-07-06 DIAGNOSIS — R404 Transient alteration of awareness: Secondary | ICD-10-CM | POA: Insufficient documentation
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
## 2018-07-14 ENCOUNTER — Ambulatory Visit (INDEPENDENT_AMBULATORY_CARE_PROVIDER_SITE_OTHER): Payer: BLUE CROSS/BLUE SHIELD

## 2018-07-14 DIAGNOSIS — J309 Allergic rhinitis, unspecified: Secondary | ICD-10-CM | POA: Diagnosis not present

## 2018-08-08 ENCOUNTER — Other Ambulatory Visit: Payer: BLUE CROSS/BLUE SHIELD

## 2018-09-12 ENCOUNTER — Other Ambulatory Visit: Payer: Self-pay

## 2018-12-26 DIAGNOSIS — Z79899 Other long term (current) drug therapy: Secondary | ICD-10-CM | POA: Diagnosis not present

## 2018-12-26 DIAGNOSIS — M545 Low back pain: Secondary | ICD-10-CM | POA: Diagnosis not present

## 2018-12-26 DIAGNOSIS — G894 Chronic pain syndrome: Secondary | ICD-10-CM | POA: Diagnosis not present

## 2018-12-28 DIAGNOSIS — E559 Vitamin D deficiency, unspecified: Secondary | ICD-10-CM | POA: Diagnosis not present

## 2018-12-28 DIAGNOSIS — Z79899 Other long term (current) drug therapy: Secondary | ICD-10-CM | POA: Diagnosis not present

## 2018-12-28 DIAGNOSIS — E538 Deficiency of other specified B group vitamins: Secondary | ICD-10-CM | POA: Diagnosis not present

## 2018-12-28 DIAGNOSIS — Z131 Encounter for screening for diabetes mellitus: Secondary | ICD-10-CM | POA: Diagnosis not present

## 2018-12-28 DIAGNOSIS — N946 Dysmenorrhea, unspecified: Secondary | ICD-10-CM | POA: Diagnosis not present

## 2018-12-28 DIAGNOSIS — E039 Hypothyroidism, unspecified: Secondary | ICD-10-CM | POA: Diagnosis not present

## 2018-12-28 DIAGNOSIS — E7800 Pure hypercholesterolemia, unspecified: Secondary | ICD-10-CM | POA: Diagnosis not present

## 2019-01-09 ENCOUNTER — Other Ambulatory Visit: Payer: Self-pay

## 2019-01-25 DIAGNOSIS — Z79899 Other long term (current) drug therapy: Secondary | ICD-10-CM | POA: Diagnosis not present

## 2019-01-25 DIAGNOSIS — G894 Chronic pain syndrome: Secondary | ICD-10-CM | POA: Diagnosis not present

## 2019-01-25 DIAGNOSIS — M5136 Other intervertebral disc degeneration, lumbar region: Secondary | ICD-10-CM | POA: Diagnosis not present

## 2019-01-25 DIAGNOSIS — Z760 Encounter for issue of repeat prescription: Secondary | ICD-10-CM | POA: Diagnosis not present

## 2019-01-26 DIAGNOSIS — E7800 Pure hypercholesterolemia, unspecified: Secondary | ICD-10-CM | POA: Diagnosis not present

## 2019-01-26 DIAGNOSIS — E559 Vitamin D deficiency, unspecified: Secondary | ICD-10-CM | POA: Diagnosis not present

## 2019-01-26 DIAGNOSIS — E538 Deficiency of other specified B group vitamins: Secondary | ICD-10-CM | POA: Diagnosis not present

## 2019-01-26 DIAGNOSIS — E039 Hypothyroidism, unspecified: Secondary | ICD-10-CM | POA: Diagnosis not present

## 2019-02-23 DIAGNOSIS — N390 Urinary tract infection, site not specified: Secondary | ICD-10-CM | POA: Diagnosis not present

## 2019-02-23 DIAGNOSIS — F5081 Binge eating disorder: Secondary | ICD-10-CM | POA: Diagnosis not present

## 2019-02-23 DIAGNOSIS — R300 Dysuria: Secondary | ICD-10-CM | POA: Diagnosis not present

## 2019-02-24 DIAGNOSIS — Z79899 Other long term (current) drug therapy: Secondary | ICD-10-CM | POA: Diagnosis not present

## 2019-02-24 DIAGNOSIS — G894 Chronic pain syndrome: Secondary | ICD-10-CM | POA: Diagnosis not present

## 2019-02-24 DIAGNOSIS — M545 Low back pain: Secondary | ICD-10-CM | POA: Diagnosis not present

## 2019-03-14 DIAGNOSIS — F3162 Bipolar disorder, current episode mixed, moderate: Secondary | ICD-10-CM | POA: Diagnosis not present

## 2019-03-20 DIAGNOSIS — N3941 Urge incontinence: Secondary | ICD-10-CM | POA: Diagnosis not present

## 2019-03-27 DIAGNOSIS — G894 Chronic pain syndrome: Secondary | ICD-10-CM | POA: Diagnosis not present

## 2019-03-27 DIAGNOSIS — M5136 Other intervertebral disc degeneration, lumbar region: Secondary | ICD-10-CM | POA: Diagnosis not present

## 2019-03-27 DIAGNOSIS — Z79899 Other long term (current) drug therapy: Secondary | ICD-10-CM | POA: Diagnosis not present

## 2019-04-10 DIAGNOSIS — N390 Urinary tract infection, site not specified: Secondary | ICD-10-CM | POA: Diagnosis not present

## 2019-04-24 DIAGNOSIS — E7800 Pure hypercholesterolemia, unspecified: Secondary | ICD-10-CM | POA: Diagnosis not present

## 2019-04-24 DIAGNOSIS — E039 Hypothyroidism, unspecified: Secondary | ICD-10-CM | POA: Diagnosis not present

## 2019-04-24 DIAGNOSIS — R7303 Prediabetes: Secondary | ICD-10-CM | POA: Diagnosis not present

## 2019-04-24 DIAGNOSIS — E559 Vitamin D deficiency, unspecified: Secondary | ICD-10-CM | POA: Diagnosis not present

## 2019-04-25 DIAGNOSIS — Z79899 Other long term (current) drug therapy: Secondary | ICD-10-CM | POA: Diagnosis not present

## 2019-04-25 DIAGNOSIS — M5136 Other intervertebral disc degeneration, lumbar region: Secondary | ICD-10-CM | POA: Diagnosis not present

## 2019-04-25 DIAGNOSIS — G894 Chronic pain syndrome: Secondary | ICD-10-CM | POA: Diagnosis not present

## 2019-05-22 DIAGNOSIS — N390 Urinary tract infection, site not specified: Secondary | ICD-10-CM | POA: Diagnosis not present

## 2019-05-22 DIAGNOSIS — R319 Hematuria, unspecified: Secondary | ICD-10-CM | POA: Diagnosis not present

## 2019-05-24 DIAGNOSIS — Z9189 Other specified personal risk factors, not elsewhere classified: Secondary | ICD-10-CM | POA: Diagnosis not present

## 2019-05-24 DIAGNOSIS — E039 Hypothyroidism, unspecified: Secondary | ICD-10-CM | POA: Diagnosis not present

## 2019-05-25 DIAGNOSIS — G894 Chronic pain syndrome: Secondary | ICD-10-CM | POA: Diagnosis not present

## 2019-05-25 DIAGNOSIS — Z79899 Other long term (current) drug therapy: Secondary | ICD-10-CM | POA: Diagnosis not present

## 2019-05-25 DIAGNOSIS — M545 Low back pain: Secondary | ICD-10-CM | POA: Diagnosis not present

## 2019-06-02 DIAGNOSIS — N390 Urinary tract infection, site not specified: Secondary | ICD-10-CM | POA: Diagnosis not present

## 2019-06-13 DIAGNOSIS — F3162 Bipolar disorder, current episode mixed, moderate: Secondary | ICD-10-CM | POA: Diagnosis not present

## 2019-06-22 DIAGNOSIS — R635 Abnormal weight gain: Secondary | ICD-10-CM | POA: Diagnosis not present

## 2019-06-22 DIAGNOSIS — R5383 Other fatigue: Secondary | ICD-10-CM | POA: Diagnosis not present

## 2019-06-23 DIAGNOSIS — M5136 Other intervertebral disc degeneration, lumbar region: Secondary | ICD-10-CM | POA: Diagnosis not present

## 2019-06-23 DIAGNOSIS — Z79899 Other long term (current) drug therapy: Secondary | ICD-10-CM | POA: Diagnosis not present

## 2019-06-23 DIAGNOSIS — G894 Chronic pain syndrome: Secondary | ICD-10-CM | POA: Diagnosis not present

## 2019-06-29 DIAGNOSIS — H52223 Regular astigmatism, bilateral: Secondary | ICD-10-CM | POA: Diagnosis not present

## 2019-06-29 DIAGNOSIS — E7800 Pure hypercholesterolemia, unspecified: Secondary | ICD-10-CM | POA: Diagnosis not present

## 2019-06-29 DIAGNOSIS — Z0100 Encounter for examination of eyes and vision without abnormal findings: Secondary | ICD-10-CM | POA: Diagnosis not present

## 2019-06-30 DIAGNOSIS — N390 Urinary tract infection, site not specified: Secondary | ICD-10-CM | POA: Diagnosis not present

## 2019-06-30 DIAGNOSIS — B951 Streptococcus, group B, as the cause of diseases classified elsewhere: Secondary | ICD-10-CM | POA: Diagnosis not present

## 2019-06-30 DIAGNOSIS — R8279 Other abnormal findings on microbiological examination of urine: Secondary | ICD-10-CM | POA: Diagnosis not present

## 2019-07-19 DIAGNOSIS — E538 Deficiency of other specified B group vitamins: Secondary | ICD-10-CM | POA: Diagnosis not present

## 2019-07-19 DIAGNOSIS — Z79899 Other long term (current) drug therapy: Secondary | ICD-10-CM | POA: Diagnosis not present

## 2019-07-19 DIAGNOSIS — E559 Vitamin D deficiency, unspecified: Secondary | ICD-10-CM | POA: Diagnosis not present

## 2019-07-19 DIAGNOSIS — E282 Polycystic ovarian syndrome: Secondary | ICD-10-CM | POA: Diagnosis not present

## 2019-07-19 DIAGNOSIS — E7800 Pure hypercholesterolemia, unspecified: Secondary | ICD-10-CM | POA: Diagnosis not present

## 2019-07-19 DIAGNOSIS — E039 Hypothyroidism, unspecified: Secondary | ICD-10-CM | POA: Diagnosis not present

## 2019-07-24 DIAGNOSIS — M545 Low back pain: Secondary | ICD-10-CM | POA: Diagnosis not present

## 2019-07-24 DIAGNOSIS — Z79899 Other long term (current) drug therapy: Secondary | ICD-10-CM | POA: Diagnosis not present

## 2019-07-24 DIAGNOSIS — G894 Chronic pain syndrome: Secondary | ICD-10-CM | POA: Diagnosis not present

## 2019-08-21 ENCOUNTER — Other Ambulatory Visit (HOSPITAL_COMMUNITY)
Admission: RE | Admit: 2019-08-21 | Discharge: 2019-08-21 | Disposition: A | Discharge status: Home / Self Care | Payer: Medicare HMO | Source: Ambulatory Visit | Attending: Family Medicine | Admitting: Family Medicine

## 2019-08-21 ENCOUNTER — Other Ambulatory Visit: Payer: Self-pay | Admitting: Family Medicine

## 2019-08-21 DIAGNOSIS — R87810 Cervical high risk human papillomavirus (HPV) DNA test positive: Secondary | ICD-10-CM | POA: Insufficient documentation

## 2019-08-21 DIAGNOSIS — F319 Bipolar disorder, unspecified: Secondary | ICD-10-CM | POA: Diagnosis not present

## 2019-08-21 DIAGNOSIS — R7309 Other abnormal glucose: Secondary | ICD-10-CM | POA: Diagnosis not present

## 2019-08-21 DIAGNOSIS — E039 Hypothyroidism, unspecified: Secondary | ICD-10-CM | POA: Diagnosis not present

## 2019-08-21 DIAGNOSIS — Z1151 Encounter for screening for human papillomavirus (HPV): Secondary | ICD-10-CM | POA: Insufficient documentation

## 2019-08-21 DIAGNOSIS — Z01411 Encounter for gynecological examination (general) (routine) with abnormal findings: Secondary | ICD-10-CM | POA: Diagnosis not present

## 2019-08-21 DIAGNOSIS — M797 Fibromyalgia: Secondary | ICD-10-CM | POA: Diagnosis not present

## 2019-08-21 DIAGNOSIS — R87610 Atypical squamous cells of undetermined significance on cytologic smear of cervix (ASC-US): Secondary | ICD-10-CM | POA: Diagnosis not present

## 2019-08-21 DIAGNOSIS — E7800 Pure hypercholesterolemia, unspecified: Secondary | ICD-10-CM | POA: Diagnosis not present

## 2019-08-21 DIAGNOSIS — Z0000 Encounter for general adult medical examination without abnormal findings: Secondary | ICD-10-CM | POA: Diagnosis not present

## 2019-08-21 DIAGNOSIS — E282 Polycystic ovarian syndrome: Secondary | ICD-10-CM | POA: Diagnosis not present

## 2019-08-21 DIAGNOSIS — Z01419 Encounter for gynecological examination (general) (routine) without abnormal findings: Secondary | ICD-10-CM | POA: Diagnosis present

## 2019-08-22 DIAGNOSIS — M545 Low back pain: Secondary | ICD-10-CM | POA: Diagnosis not present

## 2019-08-22 DIAGNOSIS — F1120 Opioid dependence, uncomplicated: Secondary | ICD-10-CM | POA: Diagnosis not present

## 2019-08-22 DIAGNOSIS — G894 Chronic pain syndrome: Secondary | ICD-10-CM | POA: Diagnosis not present

## 2019-08-22 DIAGNOSIS — Z79899 Other long term (current) drug therapy: Secondary | ICD-10-CM | POA: Diagnosis not present

## 2019-08-23 LAB — CYTOLOGY - PAP
Comment: NEGATIVE
Diagnosis: UNDETERMINED — AB
High risk HPV: POSITIVE — AB

## 2019-09-14 DIAGNOSIS — E282 Polycystic ovarian syndrome: Secondary | ICD-10-CM | POA: Diagnosis not present

## 2019-09-14 DIAGNOSIS — M25522 Pain in left elbow: Secondary | ICD-10-CM | POA: Diagnosis not present

## 2019-09-15 DIAGNOSIS — R87610 Atypical squamous cells of undetermined significance on cytologic smear of cervix (ASC-US): Secondary | ICD-10-CM | POA: Diagnosis not present

## 2019-09-15 DIAGNOSIS — R87810 Cervical high risk human papillomavirus (HPV) DNA test positive: Secondary | ICD-10-CM | POA: Diagnosis not present

## 2019-09-20 DIAGNOSIS — Z79899 Other long term (current) drug therapy: Secondary | ICD-10-CM | POA: Diagnosis not present

## 2019-09-20 DIAGNOSIS — M5136 Other intervertebral disc degeneration, lumbar region: Secondary | ICD-10-CM | POA: Diagnosis not present

## 2019-09-20 DIAGNOSIS — G894 Chronic pain syndrome: Secondary | ICD-10-CM | POA: Diagnosis not present

## 2019-09-25 DIAGNOSIS — N3020 Other chronic cystitis without hematuria: Secondary | ICD-10-CM | POA: Diagnosis not present

## 2019-09-25 DIAGNOSIS — R350 Frequency of micturition: Secondary | ICD-10-CM | POA: Diagnosis not present

## 2019-09-25 DIAGNOSIS — R102 Pelvic and perineal pain: Secondary | ICD-10-CM | POA: Diagnosis not present

## 2019-10-10 DIAGNOSIS — N3941 Urge incontinence: Secondary | ICD-10-CM | POA: Diagnosis not present

## 2019-10-10 DIAGNOSIS — R3915 Urgency of urination: Secondary | ICD-10-CM | POA: Diagnosis not present

## 2019-10-10 DIAGNOSIS — M62838 Other muscle spasm: Secondary | ICD-10-CM | POA: Diagnosis not present

## 2019-10-10 DIAGNOSIS — K5900 Constipation, unspecified: Secondary | ICD-10-CM | POA: Diagnosis not present

## 2019-10-10 DIAGNOSIS — R350 Frequency of micturition: Secondary | ICD-10-CM | POA: Diagnosis not present

## 2019-10-10 DIAGNOSIS — M6281 Muscle weakness (generalized): Secondary | ICD-10-CM | POA: Diagnosis not present

## 2019-10-10 DIAGNOSIS — M6289 Other specified disorders of muscle: Secondary | ICD-10-CM | POA: Diagnosis not present

## 2019-10-12 DIAGNOSIS — E282 Polycystic ovarian syndrome: Secondary | ICD-10-CM | POA: Diagnosis not present

## 2019-10-12 DIAGNOSIS — M25569 Pain in unspecified knee: Secondary | ICD-10-CM | POA: Diagnosis not present

## 2019-10-12 DIAGNOSIS — R5383 Other fatigue: Secondary | ICD-10-CM | POA: Diagnosis not present

## 2019-10-16 DIAGNOSIS — E7800 Pure hypercholesterolemia, unspecified: Secondary | ICD-10-CM | POA: Diagnosis not present

## 2019-10-19 DIAGNOSIS — M545 Low back pain: Secondary | ICD-10-CM | POA: Diagnosis not present

## 2019-10-19 DIAGNOSIS — G894 Chronic pain syndrome: Secondary | ICD-10-CM | POA: Diagnosis not present

## 2019-10-19 DIAGNOSIS — Z79899 Other long term (current) drug therapy: Secondary | ICD-10-CM | POA: Diagnosis not present

## 2019-11-08 DIAGNOSIS — M62838 Other muscle spasm: Secondary | ICD-10-CM | POA: Diagnosis not present

## 2019-11-08 DIAGNOSIS — N3941 Urge incontinence: Secondary | ICD-10-CM | POA: Diagnosis not present

## 2019-11-08 DIAGNOSIS — M6289 Other specified disorders of muscle: Secondary | ICD-10-CM | POA: Diagnosis not present

## 2019-11-08 DIAGNOSIS — R102 Pelvic and perineal pain: Secondary | ICD-10-CM | POA: Diagnosis not present

## 2019-11-08 DIAGNOSIS — N3020 Other chronic cystitis without hematuria: Secondary | ICD-10-CM | POA: Diagnosis not present

## 2019-11-08 DIAGNOSIS — R3915 Urgency of urination: Secondary | ICD-10-CM | POA: Diagnosis not present

## 2019-11-08 DIAGNOSIS — R350 Frequency of micturition: Secondary | ICD-10-CM | POA: Diagnosis not present

## 2019-11-08 DIAGNOSIS — M6281 Muscle weakness (generalized): Secondary | ICD-10-CM | POA: Diagnosis not present

## 2019-11-09 DIAGNOSIS — E282 Polycystic ovarian syndrome: Secondary | ICD-10-CM | POA: Diagnosis not present

## 2019-11-09 DIAGNOSIS — E039 Hypothyroidism, unspecified: Secondary | ICD-10-CM | POA: Diagnosis not present

## 2019-11-09 DIAGNOSIS — Z79899 Other long term (current) drug therapy: Secondary | ICD-10-CM | POA: Diagnosis not present

## 2019-11-09 DIAGNOSIS — E559 Vitamin D deficiency, unspecified: Secondary | ICD-10-CM | POA: Diagnosis not present

## 2019-11-09 DIAGNOSIS — E7800 Pure hypercholesterolemia, unspecified: Secondary | ICD-10-CM | POA: Diagnosis not present

## 2019-11-09 DIAGNOSIS — E538 Deficiency of other specified B group vitamins: Secondary | ICD-10-CM | POA: Diagnosis not present

## 2019-11-09 DIAGNOSIS — Z3200 Encounter for pregnancy test, result unknown: Secondary | ICD-10-CM | POA: Diagnosis not present

## 2019-11-09 DIAGNOSIS — Z1159 Encounter for screening for other viral diseases: Secondary | ICD-10-CM | POA: Diagnosis not present

## 2019-11-20 DIAGNOSIS — Z79899 Other long term (current) drug therapy: Secondary | ICD-10-CM | POA: Diagnosis not present

## 2019-11-20 DIAGNOSIS — G894 Chronic pain syndrome: Secondary | ICD-10-CM | POA: Diagnosis not present

## 2019-11-20 DIAGNOSIS — M545 Low back pain: Secondary | ICD-10-CM | POA: Diagnosis not present

## 2019-11-21 DIAGNOSIS — F3162 Bipolar disorder, current episode mixed, moderate: Secondary | ICD-10-CM | POA: Diagnosis not present

## 2019-12-05 DIAGNOSIS — F3162 Bipolar disorder, current episode mixed, moderate: Secondary | ICD-10-CM | POA: Diagnosis not present

## 2019-12-07 DIAGNOSIS — Z3200 Encounter for pregnancy test, result unknown: Secondary | ICD-10-CM | POA: Diagnosis not present

## 2019-12-07 DIAGNOSIS — E282 Polycystic ovarian syndrome: Secondary | ICD-10-CM | POA: Diagnosis not present

## 2019-12-07 DIAGNOSIS — E7800 Pure hypercholesterolemia, unspecified: Secondary | ICD-10-CM | POA: Diagnosis not present

## 2019-12-13 DIAGNOSIS — F3162 Bipolar disorder, current episode mixed, moderate: Secondary | ICD-10-CM | POA: Diagnosis not present

## 2019-12-20 DIAGNOSIS — G894 Chronic pain syndrome: Secondary | ICD-10-CM | POA: Diagnosis not present

## 2019-12-20 DIAGNOSIS — F1120 Opioid dependence, uncomplicated: Secondary | ICD-10-CM | POA: Diagnosis not present

## 2019-12-20 DIAGNOSIS — M545 Low back pain: Secondary | ICD-10-CM | POA: Diagnosis not present

## 2019-12-20 DIAGNOSIS — Z79899 Other long term (current) drug therapy: Secondary | ICD-10-CM | POA: Diagnosis not present

## 2019-12-26 DIAGNOSIS — R102 Pelvic and perineal pain: Secondary | ICD-10-CM | POA: Diagnosis not present

## 2019-12-26 DIAGNOSIS — R350 Frequency of micturition: Secondary | ICD-10-CM | POA: Diagnosis not present

## 2019-12-26 DIAGNOSIS — N3020 Other chronic cystitis without hematuria: Secondary | ICD-10-CM | POA: Diagnosis not present

## 2019-12-26 DIAGNOSIS — R3915 Urgency of urination: Secondary | ICD-10-CM | POA: Diagnosis not present

## 2019-12-26 DIAGNOSIS — N3941 Urge incontinence: Secondary | ICD-10-CM | POA: Diagnosis not present

## 2020-01-04 DIAGNOSIS — Z3200 Encounter for pregnancy test, result unknown: Secondary | ICD-10-CM | POA: Diagnosis not present

## 2020-01-04 DIAGNOSIS — E282 Polycystic ovarian syndrome: Secondary | ICD-10-CM | POA: Diagnosis not present

## 2020-01-10 DIAGNOSIS — F3162 Bipolar disorder, current episode mixed, moderate: Secondary | ICD-10-CM | POA: Diagnosis not present

## 2020-01-18 DIAGNOSIS — M545 Low back pain: Secondary | ICD-10-CM | POA: Diagnosis not present

## 2020-01-18 DIAGNOSIS — G894 Chronic pain syndrome: Secondary | ICD-10-CM | POA: Diagnosis not present

## 2020-01-18 DIAGNOSIS — Z79899 Other long term (current) drug therapy: Secondary | ICD-10-CM | POA: Diagnosis not present

## 2020-01-23 DIAGNOSIS — N3941 Urge incontinence: Secondary | ICD-10-CM | POA: Diagnosis not present

## 2020-01-23 DIAGNOSIS — N3020 Other chronic cystitis without hematuria: Secondary | ICD-10-CM | POA: Diagnosis not present

## 2020-01-23 DIAGNOSIS — R350 Frequency of micturition: Secondary | ICD-10-CM | POA: Diagnosis not present

## 2020-02-15 DIAGNOSIS — Z79899 Other long term (current) drug therapy: Secondary | ICD-10-CM | POA: Diagnosis not present

## 2020-02-15 DIAGNOSIS — M545 Low back pain: Secondary | ICD-10-CM | POA: Diagnosis not present

## 2020-02-15 DIAGNOSIS — G894 Chronic pain syndrome: Secondary | ICD-10-CM | POA: Diagnosis not present

## 2020-03-04 DIAGNOSIS — E7800 Pure hypercholesterolemia, unspecified: Secondary | ICD-10-CM | POA: Diagnosis not present

## 2020-03-04 DIAGNOSIS — R7303 Prediabetes: Secondary | ICD-10-CM | POA: Diagnosis not present

## 2020-03-04 DIAGNOSIS — E559 Vitamin D deficiency, unspecified: Secondary | ICD-10-CM | POA: Diagnosis not present

## 2020-03-04 DIAGNOSIS — E039 Hypothyroidism, unspecified: Secondary | ICD-10-CM | POA: Diagnosis not present

## 2020-03-04 DIAGNOSIS — E538 Deficiency of other specified B group vitamins: Secondary | ICD-10-CM | POA: Diagnosis not present

## 2020-03-07 DIAGNOSIS — R3915 Urgency of urination: Secondary | ICD-10-CM | POA: Diagnosis not present

## 2020-03-07 DIAGNOSIS — R350 Frequency of micturition: Secondary | ICD-10-CM | POA: Diagnosis not present

## 2020-03-07 DIAGNOSIS — N3020 Other chronic cystitis without hematuria: Secondary | ICD-10-CM | POA: Diagnosis not present

## 2020-03-07 DIAGNOSIS — N3941 Urge incontinence: Secondary | ICD-10-CM | POA: Diagnosis not present

## 2020-03-21 DIAGNOSIS — M545 Low back pain, unspecified: Secondary | ICD-10-CM | POA: Diagnosis not present

## 2020-03-21 DIAGNOSIS — Z79899 Other long term (current) drug therapy: Secondary | ICD-10-CM | POA: Diagnosis not present

## 2020-03-21 DIAGNOSIS — G894 Chronic pain syndrome: Secondary | ICD-10-CM | POA: Diagnosis not present

## 2020-03-28 ENCOUNTER — Encounter (INDEPENDENT_AMBULATORY_CARE_PROVIDER_SITE_OTHER): Payer: Self-pay | Admitting: Family Medicine

## 2020-03-28 ENCOUNTER — Ambulatory Visit (INDEPENDENT_AMBULATORY_CARE_PROVIDER_SITE_OTHER): Payer: Medicare HMO | Admitting: Family Medicine

## 2020-03-28 ENCOUNTER — Other Ambulatory Visit: Payer: Self-pay

## 2020-03-28 VITALS — BP 118/75 | HR 85 | Temp 98.0°F | Ht 64.0 in | Wt 228.0 lb

## 2020-03-28 DIAGNOSIS — R5383 Other fatigue: Secondary | ICD-10-CM | POA: Diagnosis not present

## 2020-03-28 DIAGNOSIS — E7800 Pure hypercholesterolemia, unspecified: Secondary | ICD-10-CM | POA: Diagnosis not present

## 2020-03-28 DIAGNOSIS — Z6839 Body mass index (BMI) 39.0-39.9, adult: Secondary | ICD-10-CM | POA: Diagnosis not present

## 2020-03-28 DIAGNOSIS — E559 Vitamin D deficiency, unspecified: Secondary | ICD-10-CM | POA: Diagnosis not present

## 2020-03-28 DIAGNOSIS — R7303 Prediabetes: Secondary | ICD-10-CM | POA: Diagnosis not present

## 2020-03-28 DIAGNOSIS — E538 Deficiency of other specified B group vitamins: Secondary | ICD-10-CM | POA: Diagnosis not present

## 2020-03-28 DIAGNOSIS — E039 Hypothyroidism, unspecified: Secondary | ICD-10-CM | POA: Diagnosis not present

## 2020-03-28 DIAGNOSIS — R0602 Shortness of breath: Secondary | ICD-10-CM

## 2020-03-28 DIAGNOSIS — Z0289 Encounter for other administrative examinations: Secondary | ICD-10-CM

## 2020-03-28 DIAGNOSIS — Z1331 Encounter for screening for depression: Secondary | ICD-10-CM | POA: Diagnosis not present

## 2020-03-28 DIAGNOSIS — E7849 Other hyperlipidemia: Secondary | ICD-10-CM

## 2020-03-29 LAB — CBC WITH DIFFERENTIAL/PLATELET
Basophils Absolute: 0 10*3/uL (ref 0.0–0.2)
Basos: 1 %
EOS (ABSOLUTE): 0.1 10*3/uL (ref 0.0–0.4)
Eos: 1 %
Hematocrit: 41.3 % (ref 34.0–46.6)
Hemoglobin: 13.6 g/dL (ref 11.1–15.9)
Immature Grans (Abs): 0 10*3/uL (ref 0.0–0.1)
Immature Granulocytes: 0 %
Lymphocytes Absolute: 1.7 10*3/uL (ref 0.7–3.1)
Lymphs: 28 %
MCH: 30.7 pg (ref 26.6–33.0)
MCHC: 32.9 g/dL (ref 31.5–35.7)
MCV: 93 fL (ref 79–97)
Monocytes Absolute: 0.5 10*3/uL (ref 0.1–0.9)
Monocytes: 7 %
Neutrophils Absolute: 3.8 10*3/uL (ref 1.4–7.0)
Neutrophils: 63 %
Platelets: 205 10*3/uL (ref 150–450)
RBC: 4.43 x10E6/uL (ref 3.77–5.28)
RDW: 13.1 % (ref 11.7–15.4)
WBC: 6.1 10*3/uL (ref 3.4–10.8)

## 2020-03-29 LAB — COMPREHENSIVE METABOLIC PANEL
ALT: 18 IU/L (ref 0–32)
AST: 17 IU/L (ref 0–40)
Albumin/Globulin Ratio: 1.5 (ref 1.2–2.2)
Albumin: 4.4 g/dL (ref 3.8–4.8)
Alkaline Phosphatase: 83 IU/L (ref 44–121)
BUN/Creatinine Ratio: 13 (ref 9–23)
BUN: 11 mg/dL (ref 6–20)
Bilirubin Total: 0.3 mg/dL (ref 0.0–1.2)
CO2: 21 mmol/L (ref 20–29)
Calcium: 9.6 mg/dL (ref 8.7–10.2)
Chloride: 105 mmol/L (ref 96–106)
Creatinine, Ser: 0.85 mg/dL (ref 0.57–1.00)
GFR calc Af Amer: 100 mL/min/{1.73_m2} (ref >59)
GFR calc non Af Amer: 87 mL/min/{1.73_m2} (ref >59)
Globulin, Total: 2.9 g/dL (ref 1.5–4.5)
Glucose: 93 mg/dL (ref 65–99)
Potassium: 4.4 mmol/L (ref 3.5–5.2)
Sodium: 141 mmol/L (ref 134–144)
Total Protein: 7.3 g/dL (ref 6.0–8.5)

## 2020-03-29 LAB — HEMOGLOBIN A1C
Est. average glucose Bld gHb Est-mCnc: 117 mg/dL
Hgb A1c MFr Bld: 5.7 % — ABNORMAL HIGH (ref 4.8–5.6)

## 2020-03-29 LAB — LIPID PANEL WITH LDL/HDL RATIO
Cholesterol, Total: 166 mg/dL (ref 100–199)
HDL: 46 mg/dL (ref >39)
LDL Chol Calc (NIH): 108 mg/dL — ABNORMAL HIGH (ref 0–99)
LDL/HDL Ratio: 2.3 ratio (ref 0.0–3.2)
Triglycerides: 63 mg/dL (ref 0–149)
VLDL Cholesterol Cal: 12 mg/dL (ref 5–40)

## 2020-03-29 LAB — FOLATE: Folate: >20 ng/mL (ref >3.0)

## 2020-03-29 LAB — VITAMIN D 25 HYDROXY (VIT D DEFICIENCY, FRACTURES): Vit D, 25-Hydroxy: 53.9 ng/mL (ref 30.0–100.0)

## 2020-03-29 LAB — VITAMIN B12: Vitamin B-12: 1154 pg/mL (ref 232–1245)

## 2020-03-29 LAB — T4: T4, Total: 10 ug/dL (ref 4.5–12.0)

## 2020-03-29 LAB — INSULIN, RANDOM: INSULIN: 21.9 u[IU]/mL (ref 2.6–24.9)

## 2020-03-29 LAB — T3: T3, Total: 141 ng/dL (ref 71–180)

## 2020-03-29 LAB — TSH: TSH: 1.49 u[IU]/mL (ref 0.450–4.500)

## 2020-04-02 NOTE — Progress Notes (Signed)
Chief Complaint:   OBESITY Nancy [name] (MR# [id number]) is a 40 y.o. female who presents for evaluation and treatment of obesity and related comorbidities. Current BMI is Body mass index is 39.14 kg/m. Nancy [name] has been struggling with her weight for many years and has been unsuccessful in either losing weight, maintaining weight loss, or reaching her healthy weight goal.  Nancy [name] has lost weight recently, approximately 15-20 lbs on phentermine and topiramate but is off those now. She notes significant polyphagia.  Nancy [name] is currently in the action stage of change and ready to dedicate time achieving and maintaining a healthier weight. Nancy [name] is interested in becoming our patient and working on intensive lifestyle modifications including (but not limited to) diet and exercise for weight loss.  Nancy [name]'s habits were reviewed today and are as follows: Her family eats meals together, she struggles with family and or coworkers weight loss sabotage, her desired weight loss is 43 lbs, she has been heavy most of her life, she started gaining weight at age 73, her heaviest weight ever was 254 pounds, she is a picky eater and doesn't like to eat healthier foods, she has significant food cravings issues, she snacks frequently in the evenings, she wakes up frequently in the middle of the night to eat, she frequently makes poor food choices, she has problems with excessive hunger, she frequently eats larger portions than normal and she struggles with emotional eating.  Depression Screen Nancy [name]'s Food and Mood (modified PHQ-9) score was 17.  Depression screen PHQ 2/9 03/28/2020  Decreased Interest 1  Down, Depressed, Hopeless 2  PHQ - 2 Score 3  Altered sleeping 2  Tired, decreased energy 3  Change in appetite 3  Feeling bad or failure about yourself  0  Trouble concentrating 1  Moving slowly or fidgety/restless 2  Suicidal thoughts 0  PHQ-9 Score 14   Subjective:    1. Other fatigue Kewana admits to daytime somnolence and admits to waking up still tired. Patent has a history of symptoms of daytime fatigue and morning headache. Nancy [name] generally gets 6 hours of sleep per night, and states that she has nightime awakenings. Snoring is present. Apneic episodes are present. Epworth Sleepiness Score is 23.  2. Shortness of breath on exertion Nancy [name] notes increasing shortness of breath with exercising and seems to be worsening over time with weight gain. She notes getting out of breath sooner with activity than she used to. This has not gotten worse recently. Nancy [name] denies shortness of breath at rest or orthopnea.  3. Pre-diabetes Nancy [name] is on metformin, and she notes polyuria, polydipsia, and polyphagia.   4. Hypothyroidism, unspecified type Nancy [name] is on Synthroid, and she is followed by her primary care physician. She notes cold intolerance,and she has no recent labs in Epic.  5. Vitamin D deficiency Nancy [name] is on Vit D, and she notes fatigue. She has no recent labs.  6. B12 deficiency Nancy [name] is on B12, and she has no recent labs. She notes fatigue.  7. Other hyperlipidemia Nancy [name] is on statin, and she has no recent labs.  Assessment/Plan:   1. Other fatigue Nancy [name] does feel that her weight is causing her energy to be lower than it should be. Fatigue may be related to obesity, depression or many other causes. Labs will be ordered, and in the meanwhile, Chayla will focus on self care including making healthy food choices, increasing physical activity and focusing on stress reduction.  - CBC with Differential/Platelet  2. Shortness of  breath on exertion Nancy [name] does feel that she gets out of breath more easily that she used to when she exercises. Moon's shortness of breath appears to be obesity related and exercise induced. She has agreed to work on weight loss and gradually increase exercise to treat  her exercise induced shortness of breath. Will continue to monitor closely.  - EKG 12-Lead  3. Pre-diabetes Nancy [name] will start her Category 3 plan, and will continue to work on weight loss, exercise, and decreasing simple carbohydrates to help decrease the risk of diabetes. We will check labs today.  - Comprehensive metabolic panel - Hemoglobin A1c - Insulin, random  4. Hypothyroidism, unspecified type Patient with long-standing hypothyroidism, on levothyroxine therapy. Nancy [name] appears euthyroid. We will check labs today. Orders and follow up as documented in patient record.  - T3 - T4 - TSH  5. Vitamin D deficiency Low Vitamin D level contributes to fatigue and are associated with obesity, breast, and colon cancer. We will check labs today. Nancy [name] will follow-up for routine testing of Vitamin D, at least 2-3 times per year to avoid over-replacement.  - VITAMIN D 25 Hydroxy (Vit-D Deficiency, Fractures)  6. B12 deficiency The diagnosis was reviewed with the patient. We will check labs today. We will continue to monitor. Orders and follow up as documented in patient record.  - Vitamin B12 - Folate  7. Other hyperlipidemia Cardiovascular risk and specific lipid/LDL goals reviewed. We discussed several lifestyle modifications today. Mirissa will start her Category 3 plan, and will continue to work on diet, exercise and weight loss efforts. Orders and follow up as documented in patient record.   - Lipid Panel With LDL/HDL Ratio  8. Depression screening Adylee had a positive depression screening. Depression is commonly associated with obesity and often results in emotional eating behaviors. We will monitor this closely and work on CBT to help improve the non-hunger eating patterns. Referral to Psychology may be required if no improvement is seen as she continues in our clinic.  9. Class 2 severe obesity with serious comorbidity and body mass index (BMI) of 39.0 to  39.9 in adult, unspecified obesity type ([place]) Nancy [name] is currently in the action stage of change and her goal is to continue with weight loss efforts. I recommend Nancy [name] begin the structured treatment plan as follows:  She has agreed to the Category 3 Plan.  Exercise goals: No exercise has been prescribed for now, while we concentrate on nutritional changes.   Behavioral modification strategies: no skipping meals and meal planning and cooking strategies.  She was informed of the importance of frequent follow-up visits to maximize her success with intensive lifestyle modifications for her multiple health conditions. She was informed we would discuss her lab results at her next visit unless there is a critical issue that needs to be addressed sooner. Nancy [name] agreed to keep her next visit at the agreed upon time to discuss these results.  Objective:   Blood pressure 118/75, pulse 85, temperature 98 F (36.7 C), height 5\' 4"  (1.626 m), weight 228 lb (103.4 kg), SpO2 99 %. Body mass index is 39.14 kg/m.  EKG: Normal sinus rhythm, rate 84 BPM.  Indirect Calorimeter completed today shows a VO2 of 294 and a REE of 2044.  Her calculated basal metabolic rate is 9357 thus her basal metabolic rate is better than expected.  General: Cooperative, alert, well developed, in no acute distress. HEENT: Conjunctivae and lids unremarkable. Cardiovascular: Regular rhythm.  Lungs: Normal work of breathing. Neurologic: No  focal deficits.   Lab Results  Component Value Date   CREATININE 0.85 03/28/2020   BUN 11 03/28/2020   NA 141 03/28/2020   K 4.4 03/28/2020   CL 105 03/28/2020   CO2 21 03/28/2020   Lab Results  Component Value Date   ALT 18 03/28/2020   AST 17 03/28/2020   ALKPHOS 83 03/28/2020   BILITOT 0.3 03/28/2020   Lab Results  Component Value Date   HGBA1C 5.7 (H) 03/28/2020   Lab Results  Component Value Date   INSULIN 21.9 03/28/2020   Lab Results  Component Value  Date   TSH 1.490 03/28/2020   Lab Results  Component Value Date   CHOL 166 03/28/2020   HDL 46 03/28/2020   LDLCALC 108 (H) 03/28/2020   TRIG 63 03/28/2020   Lab Results  Component Value Date   WBC 6.1 03/28/2020   HGB 13.6 03/28/2020   HCT 41.3 03/28/2020   MCV 93 03/28/2020   PLT 205 03/28/2020   No results found for: IRON, TIBC, FERRITIN Obesity Behavioral Intervention:   Approximately 15 minutes were spent on the discussion below.  ASK: We discussed the diagnosis of obesity with Nancy [name] today and Nancy [name] agreed to give [place] permission to discuss obesity behavioral modification therapy today.  ASSESS: Nancy [name] has the diagnosis of obesity and her BMI today is 39.12. Nancy [name] is in the action stage of change.   ADVISE: Janeene was educated on the multiple health risks of obesity as well as the benefit of weight loss to improve her health. She was advised of the need for long term treatment and the importance of lifestyle modifications to improve her current health and to decrease her risk of future health problems.  AGREE: Multiple dietary modification options and treatment options were discussed and Ree agreed to follow the recommendations documented in the above note.  ARRANGE: Rossana was educated on the importance of frequent visits to treat obesity as outlined per CMS and USPSTF guidelines and agreed to schedule her next follow up appointment today.  Attestation Statements:   Reviewed by clinician on day of visit: allergies, medications, problem list, medical history, surgical history, family history, social history, and previous encounter notes.   I, Trixie Dredge, am acting as transcriptionist for Dennard Nip, MD.  I have reviewed the above documentation for accuracy and completeness, and I agree with the above. - Dennard Nip, MD

## 2020-04-03 ENCOUNTER — Encounter (INDEPENDENT_AMBULATORY_CARE_PROVIDER_SITE_OTHER): Payer: Self-pay | Admitting: Family Medicine

## 2020-04-04 DIAGNOSIS — R7303 Prediabetes: Secondary | ICD-10-CM | POA: Diagnosis not present

## 2020-04-04 DIAGNOSIS — E282 Polycystic ovarian syndrome: Secondary | ICD-10-CM | POA: Diagnosis not present

## 2020-04-04 DIAGNOSIS — Z6841 Body Mass Index (BMI) 40.0 and over, adult: Secondary | ICD-10-CM | POA: Diagnosis not present

## 2020-04-08 DIAGNOSIS — F3162 Bipolar disorder, current episode mixed, moderate: Secondary | ICD-10-CM | POA: Diagnosis not present

## 2020-04-10 ENCOUNTER — Encounter (INDEPENDENT_AMBULATORY_CARE_PROVIDER_SITE_OTHER): Payer: Self-pay | Admitting: Family Medicine

## 2020-04-10 ENCOUNTER — Other Ambulatory Visit: Payer: Self-pay

## 2020-04-10 ENCOUNTER — Ambulatory Visit (INDEPENDENT_AMBULATORY_CARE_PROVIDER_SITE_OTHER): Payer: Medicare HMO | Admitting: Family Medicine

## 2020-04-10 VITALS — BP 109/72 | HR 73 | Temp 97.8°F | Ht 64.0 in | Wt 228.0 lb

## 2020-04-10 DIAGNOSIS — E559 Vitamin D deficiency, unspecified: Secondary | ICD-10-CM

## 2020-04-10 DIAGNOSIS — Z6839 Body mass index (BMI) 39.0-39.9, adult: Secondary | ICD-10-CM

## 2020-04-10 DIAGNOSIS — R7303 Prediabetes: Secondary | ICD-10-CM

## 2020-04-10 MED ORDER — METFORMIN HCL 500 MG PO TABS: ORAL_TABLET | ORAL | 0 refills | Status: DC

## 2020-04-11 ENCOUNTER — Ambulatory Visit (INDEPENDENT_AMBULATORY_CARE_PROVIDER_SITE_OTHER): Payer: BLUE CROSS/BLUE SHIELD | Admitting: Family Medicine

## 2020-04-11 NOTE — Progress Notes (Signed)
Chief Complaint:   OBESITY Nancy [name] is here to discuss her progress with her obesity treatment plan along with follow-up of her obesity related diagnoses. Nancy [name] is on the Category 3 Plan and states she is following her eating plan approximately 100% of the time. Nancy [name] states she is walking for 5-10 minutes 7 times per week.  Today's visit was #: 2 Starting weight: 228 lbs Starting date: 03/28/2020 Today's weight: 228 lbs Today's date: 04/10/2020 Total lbs lost to date: 0 Total lbs lost since last in-office visit: 0  Interim History: Nancy [name] struggled with hunger especially in mid day while following her plan. She stopped her phentermine and had polyphagia.  Subjective:   1. Pre-diabetes Nancy [name]'s A1c is elevated at 5.7. She has a strong family history of diabetes mellitus and she notes significant polyphagia especially in the afternoons. She is already on metformin but she is taking it at times that are not maximizing her benefits. I discussed labs with the patient today.  2. Vitamin D deficiency Nancy [name] is on Vit D prescription, and her level is at goal. She is at risk of over-replacement especially with weight loss. I discussed labs with the patient today.  Assessment/Plan:   1. Pre-diabetes Nancy [name] will continue to work on weight loss, exercise, and decreasing simple carbohydrates to help decrease the risk of diabetes. We will refill metformin for 1 month. Nancy [name] is to change to breakfast and lunch dose. May need to increase to TID if polyphagia does not improve.  - metFORMIN (GLUCOPHAGE) 500 MG tablet; Take 1 tablet by mouth at breakfast and 1 tablet at lunch.  Dispense: 60 tablet; Refill: 0  2. Vitamin D deficiency Low Vitamin D level contributes to fatigue and are associated with obesity, breast, and colon cancer. Nancy [name] agreed to continue taking prescription Vitamin D, and we will recheck labs in 3 months. She will follow-up for routine  testing of Vitamin D, at least 2-3 times per year to avoid over-replacement.  3. Class 2 severe obesity with serious comorbidity and body mass index (BMI) of 39.0 to 39.9 in adult, unspecified obesity type (Nancy [name]) Nancy [name] is currently in the action stage of change. As such, her goal is to continue with weight loss efforts. She has agreed to the Category 3 Plan.   Exercise goals: As is.  Behavioral modification strategies: meal planning and cooking strategies.  Nancy [name] has agreed to follow-up with our clinic in 2 weeks. She was informed of the importance of frequent follow-up visits to maximize her success with intensive lifestyle modifications for her multiple health conditions.   Objective:   Blood pressure 109/72, pulse 73, temperature 97.8 F (36.6 C), height 5\' 4"  (1.626 m), weight 228 lb (103.4 kg), SpO2 99 %. Body mass index is 39.14 kg/m.  General: Cooperative, alert, well developed, in no acute distress. HEENT: Conjunctivae and lids unremarkable. Cardiovascular: Regular rhythm.  Lungs: Normal work of breathing. Neurologic: No focal deficits.   Lab Results  Component Value Date   CREATININE 0.85 03/28/2020   BUN 11 03/28/2020   NA 141 03/28/2020   K 4.4 03/28/2020   CL 105 03/28/2020   CO2 21 03/28/2020   Lab Results  Component Value Date   ALT 18 03/28/2020   AST 17 03/28/2020   ALKPHOS 83 03/28/2020   BILITOT 0.3 03/28/2020   Lab Results  Component Value Date   HGBA1C 5.7 (H) 03/28/2020   Lab Results  Component Value Date   INSULIN 21.9 03/28/2020   Lab Results  Component Value Date   TSH 1.490 03/28/2020   Lab Results  Component Value Date   CHOL 166 03/28/2020   HDL 46 03/28/2020   LDLCALC 108 (H) 03/28/2020   TRIG 63 03/28/2020   Lab Results  Component Value Date   WBC 6.1 03/28/2020   HGB 13.6 03/28/2020   HCT 41.3 03/28/2020   MCV 93 03/28/2020   PLT 205 03/28/2020   No results found for: IRON, TIBC, FERRITIN  Attestation  Statements:   Reviewed by clinician on day of visit: allergies, medications, problem list, medical history, surgical history, family history, social history, and previous encounter notes.   I, Trixie Dredge, am acting as transcriptionist for Dennard Nip, MD.  I have reviewed the above documentation for accuracy and completeness, and I agree with the above. -  Dennard Nip, MD

## 2020-04-12 ENCOUNTER — Other Ambulatory Visit (INDEPENDENT_AMBULATORY_CARE_PROVIDER_SITE_OTHER): Payer: Self-pay | Admitting: Family Medicine

## 2020-04-12 DIAGNOSIS — R7303 Prediabetes: Secondary | ICD-10-CM

## 2020-04-17 DIAGNOSIS — M545 Low back pain, unspecified: Secondary | ICD-10-CM | POA: Diagnosis not present

## 2020-04-17 DIAGNOSIS — G894 Chronic pain syndrome: Secondary | ICD-10-CM | POA: Diagnosis not present

## 2020-04-17 DIAGNOSIS — Z79899 Other long term (current) drug therapy: Secondary | ICD-10-CM | POA: Diagnosis not present

## 2020-04-23 ENCOUNTER — Ambulatory Visit (INDEPENDENT_AMBULATORY_CARE_PROVIDER_SITE_OTHER): Payer: Medicare HMO | Admitting: Family Medicine

## 2020-04-29 ENCOUNTER — Other Ambulatory Visit: Payer: Self-pay

## 2020-04-29 ENCOUNTER — Encounter (INDEPENDENT_AMBULATORY_CARE_PROVIDER_SITE_OTHER): Payer: Self-pay | Admitting: Family Medicine

## 2020-04-29 ENCOUNTER — Ambulatory Visit (INDEPENDENT_AMBULATORY_CARE_PROVIDER_SITE_OTHER): Payer: Medicare HMO | Admitting: Family Medicine

## 2020-04-29 VITALS — BP 111/73 | HR 87 | Temp 98.3°F | Ht 64.0 in | Wt 236.0 lb

## 2020-04-29 DIAGNOSIS — K5909 Other constipation: Secondary | ICD-10-CM

## 2020-04-29 DIAGNOSIS — R7303 Prediabetes: Secondary | ICD-10-CM | POA: Diagnosis not present

## 2020-04-29 DIAGNOSIS — Z6841 Body Mass Index (BMI) 40.0 and over, adult: Secondary | ICD-10-CM | POA: Diagnosis not present

## 2020-04-29 MED ORDER — POLYETHYLENE GLYCOL 3350 17 G PO PACK: 17.0000 g | PACK | Freq: Every day | ORAL | 0 refills | Status: AC

## 2020-04-29 MED ORDER — METFORMIN HCL 500 MG PO TABS: 500.0000 mg | ORAL_TABLET | Freq: Three times a day (TID) | ORAL | 0 refills | Status: DC

## 2020-05-01 NOTE — Progress Notes (Signed)
Chief Complaint:   OBESITY Nancy [name] is here to discuss her progress with her obesity treatment plan along with follow-up of her obesity related diagnoses. Nancy [name] is on the Category 3 Plan and states she is following her eating plan approximately 85% of the time. Nancy [name] states she is walking for 10 minutes 7 times per week.  Today's visit was #: 3 Starting weight: 228 lbs Starting date: 03/28/2020 Today's weight: 236 lbs Today's date: 04/29/2020 Total lbs lost to date: 0 Total lbs lost since last in-office visit: 0  Interim History: Nancy [name] went out of town unexpectedly, and she had to eat out and she was unable to meal plan. She is retaining a lot of fluid today.  Subjective:   1. Pre-diabetes Nancy [name] notes decreased polyphagia on metformin, but still has increased polyphagia in the PM. She is tolerating metformin well.  2. Other constipation Nancy [name] notes decreased BMs frequency, and harder BMs. She has no improvement on Colace or Senokot.  Assessment/Plan:   1. Pre-diabetes Nancy [name] will continue to work on weight loss, exercise, and decreasing simple carbohydrates to help decrease the risk of diabetes. Nancy [name] agreed to increase metformin to 500 mg TID with no refills.  - metFORMIN (GLUCOPHAGE) 500 MG tablet; Take 1 tablet (500 mg total) by mouth in the morning, at noon, and at bedtime.  Dispense: 90 tablet; Refill: 0  2. Other constipation Nancy [name] was informed that a decrease in bowel movement frequency is normal while losing weight, but stools should not be hard or painful. Nancy [name] agreed to start miralax 17 grams daily with no refills, and she is to increase her water intake. Orders and follow up as documented in patient record.   - polyethylene glycol (MIRALAX) 17 g packet; Take 17 g by mouth daily.  Dispense: 30 each; Refill: 0  3. Class 3 severe obesity with serious comorbidity and body mass index (BMI) of 40.0 to 44.9 in adult,  unspecified obesity type (Nancy [name]) Nancy [name] is currently in the action stage of change. As such, her goal is to continue with weight loss efforts. She has agreed to change to the Category 2 Plan.   Exercise goals: Start cardio and strengthening.  Behavioral modification strategies: increasing lean protein intake, increasing water intake, decreasing sodium intake and decreasing eating out.  Nancy [name] has agreed to follow-up with our clinic in 2 weeks. She was informed of the importance of frequent follow-up visits to maximize her success with intensive lifestyle modifications for her multiple health conditions.   Objective:   Blood pressure 111/73, pulse 87, temperature 98.3 F (36.8 C), height 5\' 4"  (1.626 m), weight 236 lb (107 kg), SpO2 99 %. Body mass index is 40.51 kg/m.  General: Cooperative, alert, well developed, in no acute distress. HEENT: Conjunctivae and lids unremarkable. Cardiovascular: Regular rhythm.  Lungs: Normal work of breathing. Neurologic: No focal deficits.   Lab Results  Component Value Date   CREATININE 0.85 03/28/2020   BUN 11 03/28/2020   NA 141 03/28/2020   K 4.4 03/28/2020   CL 105 03/28/2020   CO2 21 03/28/2020   Lab Results  Component Value Date   ALT 18 03/28/2020   AST 17 03/28/2020   ALKPHOS 83 03/28/2020   BILITOT 0.3 03/28/2020   Lab Results  Component Value Date   HGBA1C 5.7 (H) 03/28/2020   Lab Results  Component Value Date   INSULIN 21.9 03/28/2020   Lab Results  Component Value Date   TSH 1.490 03/28/2020   Lab Results  Component Value Date   CHOL 166 03/28/2020   HDL 46 03/28/2020   LDLCALC 108 (H) 03/28/2020   TRIG 63 03/28/2020   Lab Results  Component Value Date   WBC 6.1 03/28/2020   HGB 13.6 03/28/2020   HCT 41.3 03/28/2020   MCV 93 03/28/2020   PLT 205 03/28/2020   No results found for: IRON, TIBC, FERRITIN  Obesity Behavioral Intervention:   Approximately 15 minutes were spent on the discussion  below.  ASK: We discussed the diagnosis of obesity with Nancy [name] today and Nancy [name] agreed to give [place] permission to discuss obesity behavioral modification therapy today.  ASSESS: Nancy [name] has the diagnosis of obesity and her BMI today is 40.49. Nancy [name] is in the action stage of change.   ADVISE: Nancy [name] was educated on the multiple health risks of obesity as well as the benefit of weight loss to improve her health. She was advised of the need for long term treatment and the importance of lifestyle modifications to improve her current health and to decrease her risk of future health problems.  AGREE: Multiple dietary modification options and treatment options were discussed and Nancy [name] agreed to follow the recommendations documented in the above note.  ARRANGE: Nancy [name] was educated on the importance of frequent visits to treat obesity as outlined per CMS and USPSTF guidelines and agreed to schedule her next follow up appointment today.  Attestation Statements:   Reviewed by clinician on day of visit: allergies, medications, problem list, medical history, surgical history, family history, social history, and previous encounter notes.   I, Trixie Dredge, am acting as transcriptionist for Dennard Nip, MD.  I have reviewed the above documentation for accuracy and completeness, and I agree with the above. -  Dennard Nip, MD

## 2020-05-14 ENCOUNTER — Encounter (INDEPENDENT_AMBULATORY_CARE_PROVIDER_SITE_OTHER): Payer: Self-pay | Admitting: Family Medicine

## 2020-05-14 ENCOUNTER — Other Ambulatory Visit: Payer: Self-pay

## 2020-05-14 ENCOUNTER — Ambulatory Visit (INDEPENDENT_AMBULATORY_CARE_PROVIDER_SITE_OTHER): Payer: Medicare HMO | Admitting: Family Medicine

## 2020-05-14 VITALS — BP 113/69 | HR 77 | Temp 97.8°F | Ht 64.0 in | Wt 233.0 lb

## 2020-05-14 DIAGNOSIS — Z6841 Body Mass Index (BMI) 40.0 and over, adult: Secondary | ICD-10-CM | POA: Diagnosis not present

## 2020-05-14 DIAGNOSIS — F3289 Other specified depressive episodes: Secondary | ICD-10-CM | POA: Diagnosis not present

## 2020-05-15 NOTE — Progress Notes (Signed)
Chief Complaint:   OBESITY Nancy [name] is here to discuss her progress with her obesity treatment plan along with follow-up of her obesity related diagnoses. Nancy [name] is on the Category 2 Plan and states she is following her eating plan approximately 90% of the time. Nancy [name] states she is walking for 20 minutes 4 times per week.  Today's visit was #: 4 Starting weight: 228 lbs Starting date: 03/28/2020 Today's weight: 233 lbs Today's date: 05/14/2020 Total lbs lost to date: 0 Total lbs lost since last in-office visit: 3  Interim History: Nancy [name] has done well with weight loss after changing to Category 2. She has some issues with mid-day hunger. She has started to exercise at the gym.  Subjective:   1. Other depression with emotional eating Nancy [name] is working on emotional eating, and she is doing well making better choices. She is still on her other medications and working on setting herself up for success.  Assessment/Plan:   1. Other depression with emotional eating Emotional eating strategies and meal planning strategies were discussed in depth today to help Nancy [name] deal with her emotional/non-hunger eating behaviors.  Orders and follow up as documented in patient record.   2. Class 3 severe obesity with serious comorbidity and body mass index (BMI) of 40.0 to 44.9 in adult, unspecified obesity type (Nancy [name]) Nancy [name] is currently in the action stage of change. As such, her goal is to continue with weight loss efforts. She has agreed to the Category 2 Plan.   Nancy [name] will continue Category 2 plan and she is ok to add 100 calorie snacks. Shredded [employer] were given today.  Exercise goals: As is.   Behavioral modification strategies: no skipping meals.  Nancy [name] has agreed to follow-up with our clinic in 3 weeks. She was informed of the importance of frequent follow-up visits to maximize her success with intensive lifestyle modifications for her  multiple health conditions.   Objective:   Blood pressure 113/69, pulse 77, temperature 97.8 F (36.6 C), height 5\' 4"  (1.626 m), weight 233 lb (105.7 kg), SpO2 98 %. Body mass index is 39.99 kg/m.  General: Cooperative, alert, well developed, in no acute distress. HEENT: Conjunctivae and lids unremarkable. Cardiovascular: Regular rhythm.  Lungs: Normal work of breathing. Neurologic: No focal deficits.   Lab Results  Component Value Date   CREATININE 0.85 03/28/2020   BUN 11 03/28/2020   NA 141 03/28/2020   K 4.4 03/28/2020   CL 105 03/28/2020   CO2 21 03/28/2020   Lab Results  Component Value Date   ALT 18 03/28/2020   AST 17 03/28/2020   ALKPHOS 83 03/28/2020   BILITOT 0.3 03/28/2020   Lab Results  Component Value Date   HGBA1C 5.7 (H) 03/28/2020   Lab Results  Component Value Date   INSULIN 21.9 03/28/2020   Lab Results  Component Value Date   TSH 1.490 03/28/2020   Lab Results  Component Value Date   CHOL 166 03/28/2020   HDL 46 03/28/2020   LDLCALC 108 (H) 03/28/2020   TRIG 63 03/28/2020   Lab Results  Component Value Date   WBC 6.1 03/28/2020   HGB 13.6 03/28/2020   HCT 41.3 03/28/2020   MCV 93 03/28/2020   PLT 205 03/28/2020   No results found for: IRON, TIBC, FERRITIN  Attestation Statements:   Reviewed by clinician on day of visit: allergies, medications, problem list, medical history, surgical history, family history, social history, and previous encounter notes.  Time spent on  visit including pre-visit chart review and post-visit care and charting was 32 minutes.    I, Trixie Dredge, am acting as transcriptionist for Dennard Nip, MD.  I have reviewed the above documentation for accuracy and completeness, and I agree with the above. -  Dennard Nip, MD

## 2020-05-16 DIAGNOSIS — Z79899 Other long term (current) drug therapy: Secondary | ICD-10-CM | POA: Diagnosis not present

## 2020-05-16 DIAGNOSIS — M545 Low back pain, unspecified: Secondary | ICD-10-CM | POA: Diagnosis not present

## 2020-05-16 DIAGNOSIS — G894 Chronic pain syndrome: Secondary | ICD-10-CM | POA: Diagnosis not present

## 2020-05-28 ENCOUNTER — Encounter (INDEPENDENT_AMBULATORY_CARE_PROVIDER_SITE_OTHER): Payer: Self-pay | Admitting: Family Medicine

## 2020-05-28 ENCOUNTER — Other Ambulatory Visit: Payer: Self-pay

## 2020-05-28 ENCOUNTER — Ambulatory Visit (INDEPENDENT_AMBULATORY_CARE_PROVIDER_SITE_OTHER): Payer: Medicare HMO | Admitting: Family Medicine

## 2020-05-28 VITALS — BP 115/75 | HR 94 | Temp 98.3°F | Ht 64.0 in | Wt 236.0 lb

## 2020-05-28 DIAGNOSIS — Z6841 Body Mass Index (BMI) 40.0 and over, adult: Secondary | ICD-10-CM

## 2020-05-28 DIAGNOSIS — R7303 Prediabetes: Secondary | ICD-10-CM

## 2020-05-28 MED ORDER — RYBELSUS 3 MG PO TABS: 1.0000 | ORAL_TABLET | Freq: Every day | ORAL | 0 refills | Status: DC

## 2020-05-30 NOTE — Progress Notes (Signed)
Chief Complaint:   OBESITY Nancy [name] is here to discuss her progress with her obesity treatment plan along with follow-up of her obesity related diagnoses. Nancy [name] is on the Category 2 Plan and states she is following her eating plan approximately 95% of the time. Nancy [name] states she is doing aerobic exercises and strength training for 20 minutes 4 times per week.  Today's visit was #: 5 Starting weight: 228 lbs Starting date: 03/28/2020 Today's weight: 236 lbs Today's date: 05/28/2020 Total lbs lost to date: 0 Total lbs lost since last in-office visit: 3  Interim History: Nancy [name] continues to do very well with weight loss even over the holidays. She continued to exercise over the holidays and she did some celebration eating, but she tried to portion control.  Subjective:   1. Pre-diabetes Nancy [name] is on a high dose metformin (500 mg TID), and she still notes polyphagia. Her insurance denied other GLP-1's, but she is willing to see if they will cover an oral GLP-1.  Assessment/Plan:   1. Pre-diabetes Nancy [name] will continue to follow her Category 2 plan, and will continue to work on weight loss, exercise, and decreasing simple carbohydrates to help decrease the risk of diabetes. Nancy [name] agreed to start Rybelsus 3 mg q AM on an empty stomach, and wait approximately 30 minutes to eat.   - Semaglutide (RYBELSUS) 3 MG TABS; Take 1 tablet by mouth daily.  Dispense: 30 tablet; Refill: 0  2. Class 3 severe obesity with serious comorbidity and body mass index (BMI) of 40.0 to 44.9 in adult, unspecified obesity type (HCC) Nancy [name] is currently in the action stage of change. As such, her goal is to continue with weight loss efforts. She has agreed to the Category 2 Plan + 100 calories.   Exercise goals: As is.  Behavioral modification strategies: increasing lean protein intake and meal planning and cooking strategies.  Nancy [name] has agreed to follow-up with our clinic  in 2 to 3 weeks. She was informed of the importance of frequent follow-up visits to maximize her success with intensive lifestyle modifications for her multiple health conditions.   Objective:   Blood pressure 115/75, pulse 94, temperature 98.3 F (36.8 C), height 5\' 4"  (1.626 m), weight 236 lb (107 kg), SpO2 98 %. Body mass index is 40.51 kg/m.  General: Cooperative, alert, well developed, in no acute distress. HEENT: Conjunctivae and lids unremarkable. Cardiovascular: Regular rhythm.  Lungs: Normal work of breathing. Neurologic: No focal deficits.   Lab Results  Component Value Date   CREATININE 0.85 03/28/2020   BUN 11 03/28/2020   NA 141 03/28/2020   K 4.4 03/28/2020   CL 105 03/28/2020   CO2 21 03/28/2020   Lab Results  Component Value Date   ALT 18 03/28/2020   AST 17 03/28/2020   ALKPHOS 83 03/28/2020   BILITOT 0.3 03/28/2020   Lab Results  Component Value Date   HGBA1C 5.7 (H) 03/28/2020   Lab Results  Component Value Date   INSULIN 21.9 03/28/2020   Lab Results  Component Value Date   TSH 1.490 03/28/2020   Lab Results  Component Value Date   CHOL 166 03/28/2020   HDL 46 03/28/2020   LDLCALC 108 (H) 03/28/2020   TRIG 63 03/28/2020   Lab Results  Component Value Date   WBC 6.1 03/28/2020   HGB 13.6 03/28/2020   HCT 41.3 03/28/2020   MCV 93 03/28/2020   PLT 205 03/28/2020   No results found for: IRON, TIBC, FERRITIN  Obesity Behavioral Intervention:   Approximately 15 minutes were spent on the discussion below.  ASK: We discussed the diagnosis of obesity with Zeppelin today and Rekia agreed to give [place] permission to discuss obesity behavioral modification therapy today.  ASSESS: Nancy [name] has the diagnosis of obesity and her BMI today is 40.49. Nancy [name] is in the action stage of change.   ADVISE: Nancy [name] was educated on the multiple health risks of obesity as well as the benefit of weight loss to improve her health. She was  advised of the need for long term treatment and the importance of lifestyle modifications to improve her current health and to decrease her risk of future health problems.  AGREE: Multiple dietary modification options and treatment options were discussed and Nancy [name] agreed to follow the recommendations documented in the above note.  ARRANGE: Nancy [name] was educated on the importance of frequent visits to treat obesity as outlined per CMS and USPSTF guidelines and agreed to schedule her next follow up appointment today.  Attestation Statements:   Reviewed by clinician on day of visit: allergies, medications, problem list, medical history, surgical history, family history, social history, and previous encounter notes.   I, Trixie Dredge, am acting as transcriptionist for Dennard Nip, MD.  I have reviewed the above documentation for accuracy and completeness, and I agree with the above. -  Dennard Nip, MD

## 2020-06-05 DIAGNOSIS — R350 Frequency of micturition: Secondary | ICD-10-CM | POA: Diagnosis not present

## 2020-06-05 DIAGNOSIS — N3020 Other chronic cystitis without hematuria: Secondary | ICD-10-CM | POA: Diagnosis not present

## 2020-06-05 DIAGNOSIS — R3915 Urgency of urination: Secondary | ICD-10-CM | POA: Diagnosis not present

## 2020-06-17 ENCOUNTER — Ambulatory Visit (INDEPENDENT_AMBULATORY_CARE_PROVIDER_SITE_OTHER): Payer: Medicare HMO | Admitting: Family Medicine

## 2020-06-17 ENCOUNTER — Other Ambulatory Visit: Payer: Self-pay

## 2020-06-17 ENCOUNTER — Encounter (INDEPENDENT_AMBULATORY_CARE_PROVIDER_SITE_OTHER): Payer: Self-pay | Admitting: Family Medicine

## 2020-06-17 VITALS — BP 118/75 | HR 95 | Temp 97.6°F | Ht 64.0 in | Wt 238.0 lb

## 2020-06-17 DIAGNOSIS — E1169 Type 2 diabetes mellitus with other specified complication: Secondary | ICD-10-CM | POA: Diagnosis not present

## 2020-06-17 DIAGNOSIS — E559 Vitamin D deficiency, unspecified: Secondary | ICD-10-CM

## 2020-06-17 DIAGNOSIS — Z6841 Body Mass Index (BMI) 40.0 and over, adult: Secondary | ICD-10-CM | POA: Diagnosis not present

## 2020-06-17 MED ORDER — RYBELSUS 7 MG PO TABS: 1.0000 | ORAL_TABLET | Freq: Every day | ORAL | 0 refills | Status: DC

## 2020-06-18 DIAGNOSIS — M545 Low back pain, unspecified: Secondary | ICD-10-CM | POA: Diagnosis not present

## 2020-06-18 DIAGNOSIS — G894 Chronic pain syndrome: Secondary | ICD-10-CM | POA: Diagnosis not present

## 2020-06-18 DIAGNOSIS — Z79899 Other long term (current) drug therapy: Secondary | ICD-10-CM | POA: Diagnosis not present
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
## 2020-06-20 DIAGNOSIS — M25511 Pain in right shoulder: Secondary | ICD-10-CM | POA: Diagnosis not present

## 2020-06-20 DIAGNOSIS — M25521 Pain in right elbow: Secondary | ICD-10-CM | POA: Diagnosis not present

## 2020-06-21 DIAGNOSIS — M25511 Pain in right shoulder: Secondary | ICD-10-CM | POA: Diagnosis not present

## 2020-06-21 DIAGNOSIS — M79601 Pain in right arm: Secondary | ICD-10-CM | POA: Diagnosis not present

## 2020-07-03 DIAGNOSIS — M7711 Lateral epicondylitis, right elbow: Secondary | ICD-10-CM | POA: Diagnosis not present

## 2020-07-03 DIAGNOSIS — M25511 Pain in right shoulder: Secondary | ICD-10-CM | POA: Diagnosis not present

## 2020-07-04 DIAGNOSIS — F3162 Bipolar disorder, current episode mixed, moderate: Secondary | ICD-10-CM | POA: Diagnosis not present

## 2020-07-08 ENCOUNTER — Encounter (INDEPENDENT_AMBULATORY_CARE_PROVIDER_SITE_OTHER): Payer: Self-pay | Admitting: Family Medicine

## 2020-07-08 ENCOUNTER — Other Ambulatory Visit: Payer: Self-pay

## 2020-07-08 ENCOUNTER — Ambulatory Visit (INDEPENDENT_AMBULATORY_CARE_PROVIDER_SITE_OTHER): Payer: Medicare HMO | Admitting: Family Medicine

## 2020-07-08 VITALS — BP 119/77 | HR 82 | Temp 97.4°F | Ht 64.0 in | Wt 232.0 lb

## 2020-07-08 DIAGNOSIS — R7303 Prediabetes: Secondary | ICD-10-CM | POA: Diagnosis not present

## 2020-07-08 DIAGNOSIS — E66812 Obesity, class 2: Secondary | ICD-10-CM

## 2020-07-08 DIAGNOSIS — Z6839 Body mass index (BMI) 39.0-39.9, adult: Secondary | ICD-10-CM

## 2020-07-09 NOTE — Progress Notes (Signed)
Chief Complaint:   OBESITY Nancy [name] is here to discuss her progress with her obesity treatment plan along with follow-up of her obesity related diagnoses. Nancy [name] is on following a lower carbohydrate, vegetable and lean protein rich diet plan and states she is following her eating plan approximately 95% of the time. Nancy [name] states she is walking and doing strength exercises for 30 minutes 3 times per week.  Today's visit was #: 7 Starting weight: 228 lbs Starting date: 03/28/2020 Today's weight: 232 lbs Today's date: 07/08/2020 Total lbs lost to date: 0 Total lbs lost since last in-office visit: 6  Interim History: Nancy [name] has done very well with weight loss on her Low carbohydrate plan, but she would like to look at other options. She is interested in the journaling option.  Subjective:   1. Pre-diabetes Nancy [name] stopped her Rybelssus as she felt it wasn't helping. She continues to work on diet and weight loss to improve her pre-diabetes   Assessment/Plan:   1. Pre-diabetes Nancy [name] agreed to discontinue Rybelsus, and will continue to work on weight loss, diet, exercise, and decreasing simple carbohydrates to help decrease the risk of diabetes.   2. Class 2 severe obesity with serious comorbidity and body mass index (BMI) of 39.0 to 39.9 in adult, unspecified obesity type (Nancy [name]) Nancy [name] is currently in the action stage of change. As such, her goal is to continue with weight loss efforts. She has agreed to keeping a food journal and adhering to recommended goals of 1300-1600 calories and 85+ grams of protein daily.   Exercise goals: As is.  Behavioral modification strategies: meal planning and cooking strategies and keeping a strict food journal.  Nancy [name] has agreed to follow-up with our clinic in 2 weeks. She was informed of the importance of frequent follow-up visits to maximize her success with intensive lifestyle modifications for her multiple health  conditions.   Objective:   Blood pressure 119/77, pulse 82, temperature (!) 97.4 F (36.3 C), height 5\' 4"  (1.626 m), weight 232 lb (105.2 kg), SpO2 98 %. Body mass index is 39.82 kg/m.  General: Cooperative, alert, well developed, in no acute distress. HEENT: Conjunctivae and lids unremarkable. Cardiovascular: Regular rhythm.  Lungs: Normal work of breathing. Neurologic: No focal deficits.   Lab Results  Component Value Date   CREATININE 0.85 03/28/2020   BUN 11 03/28/2020   NA 141 03/28/2020   K 4.4 03/28/2020   CL 105 03/28/2020   CO2 21 03/28/2020   Lab Results  Component Value Date   ALT 18 03/28/2020   AST 17 03/28/2020   ALKPHOS 83 03/28/2020   BILITOT 0.3 03/28/2020   Lab Results  Component Value Date   HGBA1C 5.7 (H) 03/28/2020   Lab Results  Component Value Date   INSULIN 21.9 03/28/2020   Lab Results  Component Value Date   TSH 1.490 03/28/2020   Lab Results  Component Value Date   CHOL 166 03/28/2020   HDL 46 03/28/2020   LDLCALC 108 (H) 03/28/2020   TRIG 63 03/28/2020   Lab Results  Component Value Date   WBC 6.1 03/28/2020   HGB 13.6 03/28/2020   HCT 41.3 03/28/2020   MCV 93 03/28/2020   PLT 205 03/28/2020   No results found for: IRON, TIBC, FERRITIN  Attestation Statements:   Reviewed by clinician on day of visit: allergies, medications, problem list, medical history, surgical history, family history, social history, and previous encounter notes.  Time spent on visit including pre-visit chart review  and post-visit care and charting was 45 minutes.    I, Trixie Dredge, am acting as transcriptionist for Dennard Nip, MD.  I have reviewed the above documentation for accuracy and completeness, and I agree with the above. -  Dennard Nip, MD

## 2020-07-19 DIAGNOSIS — M545 Low back pain, unspecified: Secondary | ICD-10-CM | POA: Diagnosis not present

## 2020-07-19 DIAGNOSIS — G894 Chronic pain syndrome: Secondary | ICD-10-CM | POA: Diagnosis not present

## 2020-07-19 DIAGNOSIS — Z79899 Other long term (current) drug therapy: Secondary | ICD-10-CM | POA: Diagnosis not present

## 2020-07-19 DIAGNOSIS — F1120 Opioid dependence, uncomplicated: Secondary | ICD-10-CM | POA: Diagnosis not present

## 2020-07-22 ENCOUNTER — Encounter (INDEPENDENT_AMBULATORY_CARE_PROVIDER_SITE_OTHER): Payer: Self-pay | Admitting: Physician Assistant

## 2020-07-22 ENCOUNTER — Ambulatory Visit (INDEPENDENT_AMBULATORY_CARE_PROVIDER_SITE_OTHER): Payer: Medicare HMO | Admitting: Physician Assistant

## 2020-07-22 ENCOUNTER — Other Ambulatory Visit (INDEPENDENT_AMBULATORY_CARE_PROVIDER_SITE_OTHER): Payer: Self-pay | Admitting: Physician Assistant

## 2020-07-22 ENCOUNTER — Other Ambulatory Visit: Payer: Self-pay

## 2020-07-22 VITALS — BP 116/77 | HR 86 | Temp 98.0°F | Ht 64.0 in | Wt 234.0 lb

## 2020-07-22 DIAGNOSIS — Z6839 Body mass index (BMI) 39.0-39.9, adult: Secondary | ICD-10-CM | POA: Diagnosis not present

## 2020-07-22 DIAGNOSIS — R7303 Prediabetes: Secondary | ICD-10-CM

## 2020-07-22 MED ORDER — OZEMPIC (0.25 OR 0.5 MG/DOSE) 2 MG/1.5ML ~~LOC~~ SOPN
0.2500 mg | PEN_INJECTOR | SUBCUTANEOUS | 0 refills | Status: DC
Start: 2020-07-22 — End: 2021-04-11

## 2020-07-22 MED ORDER — METFORMIN HCL 500 MG PO TABS: 500.0000 mg | ORAL_TABLET | Freq: Three times a day (TID) | ORAL | 0 refills | Status: DC

## 2020-07-22 NOTE — Telephone Encounter (Signed)
Last seen by Tracey Aguilar, PA-C. 

## 2020-07-23 NOTE — Progress Notes (Signed)
Chief Complaint:   OBESITY Shanayah is here to discuss her progress with her obesity treatment plan along with follow-up of her obesity related diagnoses. Hanadi is on keeping a food journal and adhering to recommended goals of 1300-1600 calories and 85+ grams of protein daily and states she is following her eating plan approximately 95% of the time. Lianni states she is doing cardio and strengthening for 30 minutes 3-4 times per week.  Today's visit was #: 8 Starting weight: 228 lbs Starting date: 03/28/2020 Today's weight: 234 lbs Today's date: 07/22/2020 Total lbs lost to date: 0 Total lbs lost since last in-office visit: 0  Interim History: Nikie reports that she was averaging 1500-1600 calories and getting at least 85 grams of protein. She having hunger between 10 AM-4 PM.  Subjective:   1. Pre-diabetes Chanell's last A1c was 5.7. She is on metformin, and denies nausea, vomiting, or diarrhea. She notes Rybelsus did not help her appetite, and she reports it is too expensive.  Assessment/Plan:   1. Pre-diabetes Deina will continue to work on weight loss, exercise, and decreasing simple carbohydrates to help decrease the risk of diabetes. Anapaula agreed to start Ozempic 0.25 mg SubQ weekly with no refills, and we will refill metformin for 1 month.  - metFORMIN (GLUCOPHAGE) 500 MG tablet; Take 1 tablet (500 mg total) by mouth in the morning, at noon, and at bedtime.  Dispense: 90 tablet; Refill: 0  2. Class 2 severe obesity with serious comorbidity and body mass index (BMI) of 39.0 to 39.9 in adult, unspecified obesity type ([place]) Gerturde is currently in the action stage of change. As such, her goal is to continue with weight loss efforts. She has agreed to keeping a food journal and adhering to recommended goals of 1300-1600 calories and 95 grams of protein daily.   We will recheck fasting labs in 2 weeks.  Exercise goals: As is.  Behavioral  modification strategies: increasing lean protein intake and decreasing simple carbohydrates.  Valeria has agreed to follow-up with our clinic in 2 weeks. She was informed of the importance of frequent follow-up visits to maximize her success with intensive lifestyle modifications for her multiple health conditions.   Objective:   Blood pressure 116/77, pulse 86, temperature 98 F (36.7 C), height 5\' 4"  (1.626 m), weight 234 lb (106.1 kg), SpO2 98 %. Body mass index is 40.17 kg/m.  General: Cooperative, alert, well developed, in no acute distress. HEENT: Conjunctivae and lids unremarkable. Cardiovascular: Regular rhythm.  Lungs: Normal work of breathing. Neurologic: No focal deficits.   Lab Results  Component Value Date   CREATININE 0.85 03/28/2020   BUN 11 03/28/2020   NA 141 03/28/2020   K 4.4 03/28/2020   CL 105 03/28/2020   CO2 21 03/28/2020   Lab Results  Component Value Date   ALT 18 03/28/2020   AST 17 03/28/2020   ALKPHOS 83 03/28/2020   BILITOT 0.3 03/28/2020   Lab Results  Component Value Date   HGBA1C 5.7 (H) 03/28/2020   Lab Results  Component Value Date   INSULIN 21.9 03/28/2020   Lab Results  Component Value Date   TSH 1.490 03/28/2020   Lab Results  Component Value Date   CHOL 166 03/28/2020   HDL 46 03/28/2020   LDLCALC 108 (H) 03/28/2020   TRIG 63 03/28/2020   Lab Results  Component Value Date   WBC 6.1 03/28/2020   HGB 13.6 03/28/2020   HCT 41.3 03/28/2020   MCV  93 03/28/2020   PLT 205 03/28/2020   No results found for: IRON, TIBC, FERRITIN  Obesity Behavioral Intervention:   Approximately 15 minutes were spent on the discussion below.  ASK: We discussed the diagnosis of obesity with X[place]ilt today and Nevelyn agreed to give [place] permission to discuss obesity behavioral modification therapy today.  ASSESS: Hala has the diagnosis of obesity and her BMI today is 40.15. Hasina is in the action stage of change.    ADVISE: Yoona was educated on the multiple health risks of obesity as well as the benefit of weight loss to improve her health. She was advised of the need for long term treatment and the importance of lifestyle modifications to improve her current health and to decrease her risk of future health problems.  AGREE: Multiple dietary modification options and treatment options were discussed and Kameryn agreed to follow the recommendations documented in the above note.  ARRANGE: Damaris was educated on the importance of frequent visits to treat obesity as outlined per CMS and USPSTF guidelines and agreed to schedule her next follow up appointment today.  Attestation Statements:   Reviewed by clinician on day of visit: allergies, medications, problem list, medical history, surgical history, family history, social history, and previous encounter notes.   Wilhemena Durie, am acting as transcriptionist for [employer], PA-C.  I have reviewed the above documentation for accuracy and completeness, and I agree with the above. Abby Potash, PA-C

## 2020-07-29 DIAGNOSIS — Z0100 Encounter for examination of eyes and vision without abnormal findings: Secondary | ICD-10-CM | POA: Diagnosis not present

## 2020-07-29 DIAGNOSIS — H52223 Regular astigmatism, bilateral: Secondary | ICD-10-CM | POA: Diagnosis not present

## 2020-07-29 DIAGNOSIS — E7800 Pure hypercholesterolemia, unspecified: Secondary | ICD-10-CM | POA: Diagnosis not present

## 2020-07-29 DIAGNOSIS — I10 Essential (primary) hypertension: Secondary | ICD-10-CM | POA: Diagnosis not present

## 2020-08-01 DIAGNOSIS — M7711 Lateral epicondylitis, right elbow: Secondary | ICD-10-CM | POA: Diagnosis not present

## 2020-08-01 DIAGNOSIS — M25511 Pain in right shoulder: Secondary | ICD-10-CM | POA: Diagnosis not present

## 2020-08-07 DIAGNOSIS — G894 Chronic pain syndrome: Secondary | ICD-10-CM | POA: Diagnosis not present

## 2020-08-07 DIAGNOSIS — Z6841 Body Mass Index (BMI) 40.0 and over, adult: Secondary | ICD-10-CM | POA: Diagnosis not present

## 2020-08-07 DIAGNOSIS — F1120 Opioid dependence, uncomplicated: Secondary | ICD-10-CM | POA: Diagnosis not present

## 2020-08-07 DIAGNOSIS — M545 Low back pain, unspecified: Secondary | ICD-10-CM | POA: Diagnosis not present

## 2020-08-07 DIAGNOSIS — Z79899 Other long term (current) drug therapy: Secondary | ICD-10-CM | POA: Diagnosis not present

## 2020-08-13 ENCOUNTER — Ambulatory Visit (INDEPENDENT_AMBULATORY_CARE_PROVIDER_SITE_OTHER): Payer: Medicare HMO | Admitting: Family Medicine

## 2020-08-13 ENCOUNTER — Other Ambulatory Visit: Payer: Self-pay

## 2020-08-13 ENCOUNTER — Encounter (INDEPENDENT_AMBULATORY_CARE_PROVIDER_SITE_OTHER): Payer: Self-pay | Admitting: Family Medicine

## 2020-08-13 VITALS — BP 114/70 | HR 93 | Temp 98.5°F | Ht 64.0 in | Wt 237.0 lb

## 2020-08-13 DIAGNOSIS — Z6839 Body mass index (BMI) 39.0-39.9, adult: Secondary | ICD-10-CM | POA: Diagnosis not present

## 2020-08-13 DIAGNOSIS — E7849 Other hyperlipidemia: Secondary | ICD-10-CM

## 2020-08-13 DIAGNOSIS — E559 Vitamin D deficiency, unspecified: Secondary | ICD-10-CM

## 2020-08-13 DIAGNOSIS — R7303 Prediabetes: Secondary | ICD-10-CM | POA: Diagnosis not present

## 2020-08-13 DIAGNOSIS — E039 Hypothyroidism, unspecified: Secondary | ICD-10-CM

## 2020-08-14 LAB — COMPREHENSIVE METABOLIC PANEL
ALT: 12 IU/L (ref 0–32)
AST: 13 IU/L (ref 0–40)
Albumin/Globulin Ratio: 1.5 (ref 1.2–2.2)
Albumin: 4.1 g/dL (ref 3.8–4.8)
Alkaline Phosphatase: 73 IU/L (ref 44–121)
BUN/Creatinine Ratio: 20 (ref 9–23)
BUN: 15 mg/dL (ref 6–20)
Bilirubin Total: 0.2 mg/dL (ref 0.0–1.2)
CO2: 20 mmol/L (ref 20–29)
Calcium: 8.9 mg/dL (ref 8.7–10.2)
Chloride: 102 mmol/L (ref 96–106)
Creatinine, Ser: 0.76 mg/dL (ref 0.57–1.00)
Globulin, Total: 2.8 g/dL (ref 1.5–4.5)
Glucose: 95 mg/dL (ref 65–99)
Potassium: 4.4 mmol/L (ref 3.5–5.2)
Sodium: 140 mmol/L (ref 134–144)
Total Protein: 6.9 g/dL (ref 6.0–8.5)
eGFR: 102 mL/min/{1.73_m2} (ref >59)

## 2020-08-14 LAB — T3: T3, Total: 170 ng/dL (ref 71–180)

## 2020-08-14 LAB — HEMOGLOBIN A1C
Est. average glucose Bld gHb Est-mCnc: 123 mg/dL
Hgb A1c MFr Bld: 5.9 % — ABNORMAL HIGH (ref 4.8–5.6)

## 2020-08-14 LAB — LIPID PANEL WITH LDL/HDL RATIO
Cholesterol, Total: 175 mg/dL (ref 100–199)
HDL: 43 mg/dL (ref >39)
LDL Chol Calc (NIH): 117 mg/dL — ABNORMAL HIGH (ref 0–99)
LDL/HDL Ratio: 2.7 ratio (ref 0.0–3.2)
Triglycerides: 82 mg/dL (ref 0–149)
VLDL Cholesterol Cal: 15 mg/dL (ref 5–40)

## 2020-08-14 LAB — T4, FREE: Free T4: 1.51 ng/dL (ref 0.82–1.77)

## 2020-08-14 LAB — TSH: TSH: 2.06 u[IU]/mL (ref 0.450–4.500)

## 2020-08-14 LAB — VITAMIN D 25 HYDROXY (VIT D DEFICIENCY, FRACTURES): Vit D, 25-Hydroxy: 52.3 ng/mL (ref 30.0–100.0)

## 2020-08-14 LAB — INSULIN, RANDOM: INSULIN: 20.2 u[IU]/mL (ref 2.6–24.9)

## 2020-08-19 NOTE — Progress Notes (Unsigned)
Chief Complaint:   OBESITY Nancy [name] is here to discuss her progress with her obesity treatment plan along with follow-up of her obesity related diagnoses. Nancy [name] is on keeping a food journal and adhering to recommended goals of 1300-1600 calories and 95 grams of protein daily and states she is following her eating plan approximately 30-40% of the time. Nancy [name] states she is doing aerobics and strengthening for 30 minutes 3 times per week.  Today's visit was #: 9 Starting weight: 228 lbs Starting date: 03/28/2020 Today's weight: 237 lbs Today's date: 08/13/2020 Total lbs lost to date: 0 Total lbs lost since last in-office visit: 0  Interim History: Nancy [name] has been unable to follow her plan due to financial issues. She has tried to portions control and increase protein especially with eggs as this is cheaper for her. She is continuing to exercise.  Subjective:   1. Vitamin D deficiency Nancy [name] is on Vit D prescription. Her last level was at goal. She is due to have labs rechecked.  2. Pre-diabetes Nancy [name]'s last A1c was 5.7. She is working on diet and weight loss. And she is due to have labs checked. She is stable on metformin.  3. Other hyperlipidemia Nancy [name]'s last LDL was mildly elevated. She is not on statin, and she is due for labs.  4. Hypothyroidism, unspecified type Nancy [name] is on Synthroid, and her last labs were within normal limits. She denies signs of over-replacement.  Assessment/Plan:   1. Vitamin D deficiency Low Vitamin D level contributes to fatigue and are associated with obesity, breast, and colon cancer. We will check labs today. Nancy [name] will follow-up for routine testing of Vitamin D, at least 2-3 times per year to avoid over-replacement.  - VITAMIN D 25 Hydroxy (Vit-D Deficiency, Fractures)  2. Pre-diabetes Nancy [name] will continue to work on weight loss, exercise, and decreasing simple carbohydrates to help decrease the risk  of diabetes. We will check labs today.  - Comprehensive metabolic panel - Insulin, random - Hemoglobin A1c  3. Other hyperlipidemia Cardiovascular risk and specific lipid/LDL goals reviewed. We discussed several lifestyle modifications today. We will check labs today. Nancy [name] will continue to work on diet, exercise and weight loss efforts. Orders and follow up as documented in patient record.   - Lipid Panel With LDL/HDL Ratio  4. Hypothyroidism, unspecified type We will check labs today. Nancy [name] will continue to follow up as directed. Orders and follow up as documented in patient record.  - T3 - T4, free - TSH  5. Obesity with current BMI 40.7 Nancy [name] is currently in the action stage of change. As such, her goal is to continue with weight loss efforts. She has agreed to keeping a food journal and adhering to recommended goals of 1300-1600 calories and 95+ grams of protein daily or practicing portion control and making smarter food choices, such as increasing vegetables and decreasing simple carbohydrates.   Exercise goals: As is.  Behavioral modification strategies: increasing lean protein intake and meal planning and cooking strategies.  Nancy [name] has agreed to follow-up with our clinic in 2 weeks. She was informed of the importance of frequent follow-up visits to maximize her success with intensive lifestyle modifications for her multiple health conditions.   Objective:   Blood pressure 114/70, pulse 93, temperature 98.5 F (36.9 C), height 5\' 4"  (1.626 m), weight 237 lb (107.5 kg), SpO2 97 %. Body mass index is 40.68 kg/m.  General: Cooperative, alert, well developed, in no acute distress. HEENT: Conjunctivae and lids unremarkable.  Cardiovascular: Regular rhythm.  Lungs: Normal work of breathing. Neurologic: No focal deficits.   Lab Results  Component Value Date   CREATININE 0.76 08/13/2020   BUN 15 08/13/2020   NA 140 08/13/2020   K 4.4 08/13/2020   CL 102  08/13/2020   CO2 20 08/13/2020   Lab Results  Component Value Date   ALT 12 08/13/2020   AST 13 08/13/2020   ALKPHOS 73 08/13/2020   BILITOT 0.2 08/13/2020   Lab Results  Component Value Date   HGBA1C 5.9 (H) 08/13/2020   HGBA1C 5.7 (H) 03/28/2020   Lab Results  Component Value Date   INSULIN 20.2 08/13/2020   INSULIN 21.9 03/28/2020   Lab Results  Component Value Date   TSH 2.060 08/13/2020   Lab Results  Component Value Date   CHOL 175 08/13/2020   HDL 43 08/13/2020   LDLCALC 117 (H) 08/13/2020   TRIG 82 08/13/2020   Lab Results  Component Value Date   WBC 6.1 03/28/2020   HGB 13.6 03/28/2020   HCT 41.3 03/28/2020   MCV 93 03/28/2020   PLT 205 03/28/2020   No results found for: IRON, TIBC, FERRITIN  Obesity Behavioral Intervention:   Approximately 15 minutes were spent on the discussion below.  ASK: We discussed the diagnosis of obesity with Nancy [name] today and Nancy [name] agreed to give [place] permission to discuss obesity behavioral modification therapy today.  ASSESS: Nancy [name] has the diagnosis of obesity and her BMI today is 40.66. Nancy [name] is in the action stage of change.   ADVISE: Nancy [name] was educated on the multiple health risks of obesity as well as the benefit of weight loss to improve her health. She was advised of the need for long term treatment and the importance of lifestyle modifications to improve her current health and to decrease her risk of future health problems.  AGREE: Multiple dietary modification options and treatment options were discussed and Nancy [name] agreed to follow the recommendations documented in the above note.  ARRANGE: Nancy [name] was educated on the importance of frequent visits to treat obesity as outlined per CMS and USPSTF guidelines and agreed to schedule her next follow up appointment today.  Attestation Statements:   Reviewed by clinician on day of visit: allergies, medications, problem list, medical  history, surgical history, family history, social history, and previous encounter notes.   I, Trixie Dredge, am acting as transcriptionist for Dennard Nip, MD.  I have reviewed the above documentation for accuracy and completeness, and I agree with the above. -  Dennard Nip, MD

## 2020-08-26 ENCOUNTER — Encounter (INDEPENDENT_AMBULATORY_CARE_PROVIDER_SITE_OTHER): Payer: Self-pay | Admitting: Family Medicine

## 2020-08-26 ENCOUNTER — Other Ambulatory Visit: Payer: Self-pay

## 2020-08-26 ENCOUNTER — Ambulatory Visit (INDEPENDENT_AMBULATORY_CARE_PROVIDER_SITE_OTHER): Payer: Medicare HMO | Admitting: Family Medicine

## 2020-08-26 VITALS — BP 123/79 | HR 81 | Temp 98.5°F | Ht 64.0 in | Wt 240.0 lb

## 2020-08-26 DIAGNOSIS — Z6839 Body mass index (BMI) 39.0-39.9, adult: Secondary | ICD-10-CM

## 2020-08-26 DIAGNOSIS — E66812 Obesity, class 2: Secondary | ICD-10-CM

## 2020-08-26 DIAGNOSIS — E7849 Other hyperlipidemia: Secondary | ICD-10-CM

## 2020-08-26 DIAGNOSIS — E559 Vitamin D deficiency, unspecified: Secondary | ICD-10-CM | POA: Diagnosis not present

## 2020-08-26 DIAGNOSIS — R7303 Prediabetes: Secondary | ICD-10-CM

## 2020-08-26 MED ORDER — METFORMIN HCL 500 MG PO TABS: 500.0000 mg | ORAL_TABLET | Freq: Three times a day (TID) | ORAL | 0 refills | Status: AC

## 2020-09-03 DIAGNOSIS — R7303 Prediabetes: Secondary | ICD-10-CM | POA: Diagnosis not present

## 2020-09-03 DIAGNOSIS — E282 Polycystic ovarian syndrome: Secondary | ICD-10-CM | POA: Diagnosis not present

## 2020-09-03 DIAGNOSIS — E7800 Pure hypercholesterolemia, unspecified: Secondary | ICD-10-CM | POA: Diagnosis not present

## 2020-09-03 DIAGNOSIS — Z0000 Encounter for general adult medical examination without abnormal findings: Secondary | ICD-10-CM | POA: Diagnosis not present

## 2020-09-03 DIAGNOSIS — E039 Hypothyroidism, unspecified: Secondary | ICD-10-CM | POA: Diagnosis not present

## 2020-09-03 DIAGNOSIS — G4733 Obstructive sleep apnea (adult) (pediatric): Secondary | ICD-10-CM | POA: Diagnosis not present

## 2020-09-03 DIAGNOSIS — N3281 Overactive bladder: Secondary | ICD-10-CM | POA: Diagnosis not present

## 2020-09-03 DIAGNOSIS — M797 Fibromyalgia: Secondary | ICD-10-CM | POA: Diagnosis not present

## 2020-09-03 DIAGNOSIS — Z1389 Encounter for screening for other disorder: Secondary | ICD-10-CM | POA: Diagnosis not present

## 2020-09-03 DIAGNOSIS — M542 Cervicalgia: Secondary | ICD-10-CM | POA: Diagnosis not present

## 2020-09-03 DIAGNOSIS — G43009 Migraine without aura, not intractable, without status migrainosus: Secondary | ICD-10-CM | POA: Diagnosis not present

## 2020-09-03 DIAGNOSIS — Z113 Encounter for screening for infections with a predominantly sexual mode of transmission: Secondary | ICD-10-CM | POA: Diagnosis not present

## 2020-09-10 NOTE — Progress Notes (Signed)
Chief Complaint:   OBESITY Nancy [name] is here to discuss her progress with her obesity treatment plan along with follow-up of her obesity related diagnoses. Nancy [name] is on keeping a food journal and adhering to recommended goals of 1300-1600 calories and 95+ grams of protein daily or practicing portion control and making smarter food choices, such as increasing vegetables and decreasing simple carbohydrates and states she is following her eating plan approximately (unknown)% of the time. Nancy [name] states she is walking for 30 minutes 3 times per week.  Today's visit was #: 10 Starting weight: 228 lbs Starting date: 03/28/2020 Today's weight: 240 lbs Today's date: 08/26/2020 Total lbs lost to date: 0 Total lbs lost since last in-office visit: 0  Interim History: Nancy [name] continues to work on weight loss. She is in a better financial situation, and she feels she will be able to follow a plan more closely. She would like to change back to a lower carbohydrate plan.  Subjective:   1. Pre-diabetes Nancy [name] is on metformin, but her A1c continues to worsen. She will be changing back to a low carbohydrate plan which should help. She cannot afford her Ozempic due to hitting a donut hole. I discussed labs with the patient today.  2. Vitamin D deficiency Nancy [name]'s Vit D level is not at goal, and she denies nausea or vomiting. I discussed labs with the patient today.  3. Other hyperlipidemia Nancy [name]'s LDL is slightly elevated. She is ready to work on weight loss and exercise again, which should help. I discussed labs with the patient today.  Assessment/Plan:   1. Pre-diabetes Nancy [name] will continue to work on diet and increase exercise to 5 times per week, and decreasing simple carbohydrates to help decrease the risk of diabetes. We will refill metformin for 1 month.  - metFORMIN (GLUCOPHAGE) 500 MG tablet; Take 1 tablet (500 mg total) by mouth in the morning, at noon, and at  bedtime.  Dispense: 90 tablet; Refill: 0  2. Vitamin D deficiency Low Vitamin D level contributes to fatigue and are associated with obesity, breast, and colon cancer. Nancy [name] agreed to continue taking Vitamin D as is and will follow-up for routine testing of Vitamin D, at least 2-3 times per year to avoid over-replacement.  3. Other hyperlipidemia Cardiovascular risk and specific lipid/LDL goals reviewed. We discussed several lifestyle modifications today. Nancy [name] will continue to work on diet, exercise and weight loss efforts. We will recheck labs in 3 months. Orders and follow up as documented in patient record.   4. Obesity with current BMI of 41.2 Nancy [name] is currently in the action stage of change. As such, her goal is to continue with weight loss efforts. She has agreed to change to following a lower carbohydrate, vegetable and lean protein rich diet plan.   Exercise goals: As is.  Behavioral modification strategies: increasing lean protein intake and meal planning and cooking strategies.  Nancy [name] has agreed to follow-up with our clinic in 4 weeks. She was informed of the importance of frequent follow-up visits to maximize her success with intensive lifestyle modifications for her multiple health conditions.   Objective:   Blood pressure 123/79, pulse 81, temperature 98.5 F (36.9 C), height 5\' 4"  (1.626 m), weight 240 lb (108.9 kg), SpO2 99 %. Body mass index is 41.2 kg/m.  General: Cooperative, alert, well developed, in no acute distress. HEENT: Conjunctivae and lids unremarkable. Cardiovascular: Regular rhythm.  Lungs: Normal work of breathing. Neurologic: No focal deficits.   Lab Results  Component  Value Date   CREATININE 0.76 08/13/2020   BUN 15 08/13/2020   NA 140 08/13/2020   K 4.4 08/13/2020   CL 102 08/13/2020   CO2 20 08/13/2020   Lab Results  Component Value Date   ALT 12 08/13/2020   AST 13 08/13/2020   ALKPHOS 73 08/13/2020   BILITOT 0.2  08/13/2020   Lab Results  Component Value Date   HGBA1C 5.9 (H) 08/13/2020   HGBA1C 5.7 (H) 03/28/2020   Lab Results  Component Value Date   INSULIN 20.2 08/13/2020   INSULIN 21.9 03/28/2020   Lab Results  Component Value Date   TSH 2.060 08/13/2020   Lab Results  Component Value Date   CHOL 175 08/13/2020   HDL 43 08/13/2020   LDLCALC 117 (H) 08/13/2020   TRIG 82 08/13/2020   Lab Results  Component Value Date   WBC 6.1 03/28/2020   HGB 13.6 03/28/2020   HCT 41.3 03/28/2020   MCV 93 03/28/2020   PLT 205 03/28/2020   No results found for: IRON, TIBC, FERRITIN  Obesity Behavioral Intervention:   Approximately 15 minutes were spent on the discussion below.  ASK: We discussed the diagnosis of obesity with Nancy [name] today and Nancy [name] agreed to give [place] permission to discuss obesity behavioral modification therapy today.  ASSESS: Nancy [name] has the diagnosis of obesity and her BMI today is 41.18. Nancy [name] is in the action stage of change.   ADVISE: Nancy [name] was educated on the multiple health risks of obesity as well as the benefit of weight loss to improve her health. She was advised of the need for long term treatment and the importance of lifestyle modifications to improve her current health and to decrease her risk of future health problems.  AGREE: Multiple dietary modification options and treatment options were discussed and Nancy [name] agreed to follow the recommendations documented in the above note.  ARRANGE: Nancy [name] was educated on the importance of frequent visits to treat obesity as outlined per CMS and USPSTF guidelines and agreed to schedule her next follow up appointment today.  Attestation Statements:   Reviewed by clinician on day of visit: allergies, medications, problem list, medical history, surgical history, family history, social history, and previous encounter notes.   I, Trixie Dredge, am acting as transcriptionist for Dennard Nip,  MD.  I have reviewed the above documentation for accuracy and completeness, and I agree with the above. -  Dennard Nip, MD

## 2020-09-12 DIAGNOSIS — M7711 Lateral epicondylitis, right elbow: Secondary | ICD-10-CM | POA: Diagnosis not present

## 2020-09-13 DIAGNOSIS — Z6841 Body Mass Index (BMI) 40.0 and over, adult: Secondary | ICD-10-CM | POA: Diagnosis not present

## 2020-09-13 DIAGNOSIS — M545 Low back pain, unspecified: Secondary | ICD-10-CM | POA: Diagnosis not present

## 2020-09-13 DIAGNOSIS — Z79899 Other long term (current) drug therapy: Secondary | ICD-10-CM | POA: Diagnosis not present

## 2020-09-13 DIAGNOSIS — F1120 Opioid dependence, uncomplicated: Secondary | ICD-10-CM | POA: Diagnosis not present

## 2020-09-13 DIAGNOSIS — G894 Chronic pain syndrome: Secondary | ICD-10-CM | POA: Diagnosis not present

## 2020-09-16 ENCOUNTER — Encounter (INDEPENDENT_AMBULATORY_CARE_PROVIDER_SITE_OTHER): Payer: Self-pay | Admitting: Family Medicine

## 2020-09-16 DIAGNOSIS — Z01419 Encounter for gynecological examination (general) (routine) without abnormal findings: Secondary | ICD-10-CM | POA: Diagnosis not present

## 2020-09-16 DIAGNOSIS — Z124 Encounter for screening for malignant neoplasm of cervix: Secondary | ICD-10-CM | POA: Diagnosis not present

## 2020-09-16 DIAGNOSIS — R87810 Cervical high risk human papillomavirus (HPV) DNA test positive: Secondary | ICD-10-CM | POA: Diagnosis not present

## 2020-09-16 DIAGNOSIS — Z01411 Encounter for gynecological examination (general) (routine) with abnormal findings: Secondary | ICD-10-CM | POA: Diagnosis not present

## 2020-09-16 DIAGNOSIS — Z113 Encounter for screening for infections with a predominantly sexual mode of transmission: Secondary | ICD-10-CM | POA: Diagnosis not present

## 2020-09-16 DIAGNOSIS — Z309 Encounter for contraceptive management, unspecified: Secondary | ICD-10-CM | POA: Diagnosis not present

## 2020-09-16 DIAGNOSIS — R87610 Atypical squamous cells of undetermined significance on cytologic smear of cervix (ASC-US): Secondary | ICD-10-CM | POA: Diagnosis not present

## 2020-09-16 DIAGNOSIS — Z6841 Body Mass Index (BMI) 40.0 and over, adult: Secondary | ICD-10-CM | POA: Diagnosis not present

## 2020-09-16 DIAGNOSIS — Z779 Other contact with and (suspected) exposures hazardous to health: Secondary | ICD-10-CM | POA: Diagnosis not present

## 2020-09-16 NOTE — Telephone Encounter (Signed)
Dr.Beasley 

## 2020-09-23 ENCOUNTER — Ambulatory Visit (INDEPENDENT_AMBULATORY_CARE_PROVIDER_SITE_OTHER): Payer: Medicare HMO | Admitting: Family Medicine

## 2020-09-27 DIAGNOSIS — F4320 Adjustment disorder, unspecified: Secondary | ICD-10-CM | POA: Diagnosis not present

## 2020-09-27 DIAGNOSIS — F3162 Bipolar disorder, current episode mixed, moderate: Secondary | ICD-10-CM | POA: Diagnosis not present

## 2020-09-28 ENCOUNTER — Other Ambulatory Visit (INDEPENDENT_AMBULATORY_CARE_PROVIDER_SITE_OTHER): Payer: Self-pay | Admitting: Family Medicine

## 2020-09-28 DIAGNOSIS — R7303 Prediabetes: Secondary | ICD-10-CM

## 2020-09-30 NOTE — Telephone Encounter (Signed)
Pt last seen by Dr. Beasley.  

## 2020-10-11 DIAGNOSIS — Z1231 Encounter for screening mammogram for malignant neoplasm of breast: Secondary | ICD-10-CM | POA: Diagnosis not present

## 2020-10-14 DIAGNOSIS — G894 Chronic pain syndrome: Secondary | ICD-10-CM | POA: Diagnosis not present

## 2020-10-14 DIAGNOSIS — F1120 Opioid dependence, uncomplicated: Secondary | ICD-10-CM | POA: Diagnosis not present

## 2020-10-14 DIAGNOSIS — M545 Low back pain, unspecified: Secondary | ICD-10-CM | POA: Diagnosis not present

## 2020-10-14 DIAGNOSIS — Z6841 Body Mass Index (BMI) 40.0 and over, adult: Secondary | ICD-10-CM | POA: Diagnosis not present

## 2020-10-14 DIAGNOSIS — Z79899 Other long term (current) drug therapy: Secondary | ICD-10-CM | POA: Diagnosis not present

## 2020-10-24 DIAGNOSIS — F4320 Adjustment disorder, unspecified: Secondary | ICD-10-CM | POA: Diagnosis not present

## 2020-10-24 DIAGNOSIS — F3162 Bipolar disorder, current episode mixed, moderate: Secondary | ICD-10-CM | POA: Diagnosis not present

## 2020-11-14 DIAGNOSIS — M25511 Pain in right shoulder: Secondary | ICD-10-CM | POA: Diagnosis not present

## 2020-11-14 DIAGNOSIS — M7711 Lateral epicondylitis, right elbow: Secondary | ICD-10-CM | POA: Diagnosis not present

## 2020-11-18 DIAGNOSIS — M545 Low back pain, unspecified: Secondary | ICD-10-CM | POA: Diagnosis not present

## 2020-11-18 DIAGNOSIS — Z6841 Body Mass Index (BMI) 40.0 and over, adult: Secondary | ICD-10-CM | POA: Diagnosis not present

## 2020-11-18 DIAGNOSIS — F1120 Opioid dependence, uncomplicated: Secondary | ICD-10-CM | POA: Diagnosis not present

## 2020-11-18 DIAGNOSIS — Z79899 Other long term (current) drug therapy: Secondary | ICD-10-CM | POA: Diagnosis not present

## 2020-11-18 DIAGNOSIS — G894 Chronic pain syndrome: Secondary | ICD-10-CM | POA: Diagnosis not present

## 2020-12-02 DIAGNOSIS — N3020 Other chronic cystitis without hematuria: Secondary | ICD-10-CM | POA: Diagnosis not present

## 2020-12-02 DIAGNOSIS — N3941 Urge incontinence: Secondary | ICD-10-CM | POA: Diagnosis not present

## 2020-12-03 ENCOUNTER — Ambulatory Visit: Payer: Medicare HMO | Admitting: Sports Medicine

## 2020-12-03 ENCOUNTER — Other Ambulatory Visit: Payer: Self-pay

## 2020-12-03 VITALS — BP 118/84 | Ht 64.0 in | Wt 250.0 lb

## 2020-12-03 DIAGNOSIS — M7711 Lateral epicondylitis, right elbow: Secondary | ICD-10-CM

## 2020-12-03 NOTE — Progress Notes (Signed)
   Subjective:    Patient ID: Nancy [name], female    DOB: April 12, 1981, 40 y.o.   MRN: [id number]  HPI chief complaint: Right elbow pain  Very pleasant 40 year old female comes in today to discuss soundwave treatment for chronic right elbow lateral epicondylitis.  She was previously treated by Dr. Sheppard Coil who recommended that she come in today.  She has had symptoms for about 6 months.  No trauma.  No previous occurrences.  She has tried topical nitroglycerin and home exercises.  She recently received a cortisone injection in her right shoulder which has helped her right elbow some.  She localizes all of her pain to the lateral elbow.  Most noticeable with gripping or picking up objects.  Denies medial elbow pain.  Past medical history reviewed Medications reviewed Allergies reviewed    Review of Systems As above    Objective:   Physical Exam  Well-developed, well-nourished.  No acute distress  Right elbow: Full range of motion.  No effusion.  No soft tissue swelling.  There is tenderness to palpation directly over the lateral epicondyle with reproducible pain with resisted ECRB testing.  Good pulses distally.      Assessment & Plan:   Chronic right elbow pain secondary to lateral epicondylitis  I discussed other treatment options including formal physical therapy and soundwave treatment.  She would like to go ahead and pursue her first soundwave treatment.  We will schedule this at her earliest convenience.  We will initially start with 4 treatments, 1 weekly, as long as she tolerates it.  She will continue with her home exercises as well.

## 2020-12-10 ENCOUNTER — Ambulatory Visit (INDEPENDENT_AMBULATORY_CARE_PROVIDER_SITE_OTHER): Payer: Self-pay | Admitting: Sports Medicine

## 2020-12-10 ENCOUNTER — Other Ambulatory Visit: Payer: Self-pay

## 2020-12-10 VITALS — Ht 64.0 in | Wt 250.0 lb

## 2020-12-10 DIAGNOSIS — M7711 Lateral epicondylitis, right elbow: Secondary | ICD-10-CM

## 2020-12-10 NOTE — Progress Notes (Signed)
Patient ID: Ralene Gasparyan, female   DOB: [birth year], 40 y.o.   MRN: [id number]  Keenya presents today for her first soundwave treatment of her right elbow lateral epicondylitis.  Procedure: ECSWT Indications: Right elbow lateral epicondylitis   Procedure Details Consent: Risks of procedure as well as the alternatives and risks of each were explained to the patient.  Written consent for procedure obtained. Time Out: Verified patient identification, verified procedure, site was marked, verified correct patient position, medications/allergies/relevent history reviewed.  The area was cleaned with alcohol swab.     The right lateral epicondyle was targeted for Extracorporeal shockwave therapy.    Preset: lateral epicondylitis Power Level: 5 Frequency: 60 Impulse/cycles: 1500 Head size: regular   Patient tolerated procedure well without immediate complications.  She will return next week for her second treatment.  If she tolerates today's treatment well then we will likely increase the intensity at her next visit

## 2020-12-18 DIAGNOSIS — Z6841 Body Mass Index (BMI) 40.0 and over, adult: Secondary | ICD-10-CM | POA: Diagnosis not present

## 2020-12-18 DIAGNOSIS — G894 Chronic pain syndrome: Secondary | ICD-10-CM | POA: Diagnosis not present

## 2020-12-18 DIAGNOSIS — M545 Low back pain, unspecified: Secondary | ICD-10-CM | POA: Diagnosis not present

## 2020-12-18 DIAGNOSIS — E559 Vitamin D deficiency, unspecified: Secondary | ICD-10-CM | POA: Diagnosis not present

## 2020-12-18 DIAGNOSIS — Z79899 Other long term (current) drug therapy: Secondary | ICD-10-CM | POA: Diagnosis not present

## 2020-12-19 ENCOUNTER — Ambulatory Visit: Payer: Medicare HMO | Admitting: Sports Medicine

## 2020-12-19 DIAGNOSIS — F4320 Adjustment disorder, unspecified: Secondary | ICD-10-CM | POA: Diagnosis not present

## 2020-12-19 DIAGNOSIS — F3162 Bipolar disorder, current episode mixed, moderate: Secondary | ICD-10-CM | POA: Diagnosis not present

## 2021-01-08 DIAGNOSIS — R7303 Prediabetes: Secondary | ICD-10-CM | POA: Diagnosis not present

## 2021-01-08 DIAGNOSIS — M797 Fibromyalgia: Secondary | ICD-10-CM | POA: Diagnosis not present

## 2021-01-08 DIAGNOSIS — G4733 Obstructive sleep apnea (adult) (pediatric): Secondary | ICD-10-CM | POA: Diagnosis not present

## 2021-01-08 DIAGNOSIS — E7800 Pure hypercholesterolemia, unspecified: Secondary | ICD-10-CM | POA: Diagnosis not present

## 2021-01-09 DIAGNOSIS — Z6841 Body Mass Index (BMI) 40.0 and over, adult: Secondary | ICD-10-CM | POA: Diagnosis not present

## 2021-01-13 ENCOUNTER — Other Ambulatory Visit (HOSPITAL_COMMUNITY): Payer: Self-pay | Admitting: Surgery

## 2021-01-13 ENCOUNTER — Other Ambulatory Visit: Payer: Self-pay | Admitting: Surgery

## 2021-01-13 DIAGNOSIS — Z6841 Body Mass Index (BMI) 40.0 and over, adult: Secondary | ICD-10-CM

## 2021-01-17 DIAGNOSIS — R030 Elevated blood-pressure reading, without diagnosis of hypertension: Secondary | ICD-10-CM | POA: Diagnosis not present

## 2021-01-17 DIAGNOSIS — Z6841 Body Mass Index (BMI) 40.0 and over, adult: Secondary | ICD-10-CM | POA: Diagnosis not present

## 2021-01-17 DIAGNOSIS — G894 Chronic pain syndrome: Secondary | ICD-10-CM | POA: Diagnosis not present

## 2021-01-17 DIAGNOSIS — Z79899 Other long term (current) drug therapy: Secondary | ICD-10-CM | POA: Diagnosis not present

## 2021-01-17 DIAGNOSIS — M545 Low back pain, unspecified: Secondary | ICD-10-CM | POA: Diagnosis not present

## 2021-01-28 DIAGNOSIS — E871 Hypo-osmolality and hyponatremia: Secondary | ICD-10-CM | POA: Diagnosis not present

## 2021-01-30 ENCOUNTER — Ambulatory Visit (HOSPITAL_COMMUNITY)
Admission: RE | Admit: 2021-01-30 | Discharge: 2021-01-30 | Disposition: A | Discharge status: Home / Self Care | Payer: Medicare HMO | Source: Ambulatory Visit | Attending: Surgery | Admitting: Surgery

## 2021-01-30 ENCOUNTER — Other Ambulatory Visit: Payer: Self-pay

## 2021-01-30 ENCOUNTER — Other Ambulatory Visit (HOSPITAL_COMMUNITY): Payer: Self-pay | Admitting: Surgery

## 2021-01-30 DIAGNOSIS — K7689 Other specified diseases of liver: Secondary | ICD-10-CM | POA: Diagnosis not present

## 2021-01-30 DIAGNOSIS — Z01818 Encounter for other preprocedural examination: Secondary | ICD-10-CM | POA: Diagnosis not present

## 2021-01-30 DIAGNOSIS — Z6841 Body Mass Index (BMI) 40.0 and over, adult: Secondary | ICD-10-CM | POA: Diagnosis not present

## 2021-01-30 DIAGNOSIS — K224 Dyskinesia of esophagus: Secondary | ICD-10-CM | POA: Diagnosis not present

## 2021-02-17 DIAGNOSIS — R030 Elevated blood-pressure reading, without diagnosis of hypertension: Secondary | ICD-10-CM | POA: Diagnosis not present

## 2021-02-17 DIAGNOSIS — G894 Chronic pain syndrome: Secondary | ICD-10-CM | POA: Diagnosis not present

## 2021-02-17 DIAGNOSIS — Z79899 Other long term (current) drug therapy: Secondary | ICD-10-CM | POA: Diagnosis not present

## 2021-02-17 DIAGNOSIS — M545 Low back pain, unspecified: Secondary | ICD-10-CM | POA: Diagnosis not present

## 2021-02-20 ENCOUNTER — Other Ambulatory Visit: Payer: Self-pay

## 2021-02-20 ENCOUNTER — Encounter: Payer: Medicare HMO | Attending: Surgery | Admitting: Skilled Nursing Facility1

## 2021-02-20 DIAGNOSIS — R7303 Prediabetes: Secondary | ICD-10-CM | POA: Insufficient documentation

## 2021-02-20 DIAGNOSIS — E669 Obesity, unspecified: Secondary | ICD-10-CM

## 2021-02-20 DIAGNOSIS — Z713 Dietary counseling and surveillance: Secondary | ICD-10-CM | POA: Insufficient documentation

## 2021-02-20 DIAGNOSIS — Z6841 Body Mass Index (BMI) 40.0 and over, adult: Secondary | ICD-10-CM | POA: Insufficient documentation

## 2021-02-20 NOTE — Progress Notes (Signed)
Nutrition Assessment for Bariatric Surgery Medical Nutrition Therapy Appt Start Time: 7:25  End Time: 8:25  Patient was seen on 02/20/2021 for Pre-Operative Nutrition Assessment. Letter of approval faxed to [facility] bariatric surgery program coordinator on 02/20/2021.   Referral stated Supervised Weight Loss (SWL) visits needed: 0  Pt completed visits.   Pt has cleared nutrition requirements.    Planned surgery: Gastric Band Pt expectation of surgery: to lose weight Pt expectation of dietitian: to help guide me    NUTRITION ASSESSMENT   Anthropometrics  Start weight at [facility]: 276 lbs (date: 02/20/2021)  Height: 64 in BMI: 47.38 kg/m2     Clinical  Medical hx: bipolar, depression, anxiety, PCOS, fibomyalgia, prediabetes Medications: see list; vitamin D, vitamin C, vitamin B12  Labs: A1C 5.9 Notable signs/symptoms: N/A Any previous deficiencies? Vitamin D, vitmain B12  Micronutrient Nutrition Focused Physical Exam: Hair: No issues observed Eyes: No issues observed Mouth: No issues observed Neck: No issues observed Nails: No issues observed Skin: No issues observed  Lifestyle & Dietary Hx  Pt states she wears a full set of dentures.  Pt states she trie to walk on her treadmill for 10-20 minutes daily.  Pt states she has 2 dogs and 9 cats.  Pt state she feels her excess weight was due to bordum eating but now since gaining new hobbies such as looming and diamond painting she will be able to lose weight and maintian the loss having started these about 3-5 months.  Pt state she has been working on reducing her portion sizes and increasing her veggies. Pt state she cooks once a week and eats it all week. Pt states she measures out her pretezels.   24-Hr Dietary Recall First Meal: cereal Snack: cinn applesauce Second Meal: frozen meal Snack:  Third Meal: broccoli or green beans + meatloaf Snack:  Beverages: water, diet soda, whole milk   Estimated Energy  Needs Calories: 1500   NUTRITION DIAGNOSIS  Overweight/obesity ([place]-3.3) related to past poor dietary habits and physical inactivity as evidenced by patient w/ planned Gastric Band surgery following dietary guidelines for continued weight loss.    NUTRITION INTERVENTION  Nutrition counseling (C-1) and education (E-2) to facilitate bariatric surgery goals.  Educated pt on micronutrient deficiencies post surgery and strategies to mitigate that risk   Pre-Op Goals Reviewed with the Patient Track food and beverage intake (pen and paper, MyFitness Pal, Baritastic app, etc.) Make healthy food choices while monitoring portion sizes Consume 3 meals per day or try to eat every 3-5 hours Avoid concentrated sugars and fried foods Keep sugar & fat in the single digits per serving on food labels Practice CHEWING your food (aim for applesauce consistency) Practice not drinking 15 minutes before, during, and 30 minutes after each meal and snack Avoid all carbonated beverages (ex: soda, sparkling beverages)  Limit caffeinated beverages (ex: coffee, tea, energy drinks) Avoid all sugar-sweetened beverages (ex: regular soda, sports drinks)  Avoid alcohol  Aim for 64-100 ounces of FLUID daily (with at least half of fluid intake being plain water)  Aim for at least 60-80 grams of PROTEIN daily Look for a liquid protein source that contains ?15 g protein and ?5 g carbohydrate (ex: shakes, drinks, shots) Make a list of non-food related activities Physical activity is an important part of a healthy lifestyle so keep it moving! The goal is to reach 150 minutes of exercise per week, including cardiovascular and weight baring activity. Reduce cows milk to 1% Make a balanced  meal with non starchy veggies for lunch Limit fruit to 3 servings per day  *Goals that are bolded indicate the pt would like to start working towards these  Handouts Provided Include  Bariatric Surgery handouts (Nutrition Visits, Pre-Op  Goals, Protein Shakes, Vitamins & Minerals)  Learning Style & Readiness for Change Teaching method utilized: Visual & Auditory  Demonstrated degree of understanding via: Teach Back  Readiness Level: Action Barriers to learning/adherence to lifestyle change: none identified     MONITORING & EVALUATION Dietary intake, weekly physical activity, body weight, and pre-op goals reached at next nutrition visit.    Next Steps  Patient is to follow up at [place] for Pre-Op Class >2 weeks before surgery for further nutrition education.  Pt has completed visits. No further supervised visits required/recomended

## 2021-02-26 ENCOUNTER — Ambulatory Visit: Payer: Medicare HMO | Admitting: Psychology

## 2021-02-27 ENCOUNTER — Ambulatory Visit (INDEPENDENT_AMBULATORY_CARE_PROVIDER_SITE_OTHER): Payer: Medicare HMO | Admitting: Psychology

## 2021-02-27 ENCOUNTER — Telehealth: Payer: Self-pay | Admitting: Skilled Nursing Facility1

## 2021-02-27 DIAGNOSIS — F4320 Adjustment disorder, unspecified: Secondary | ICD-10-CM | POA: Diagnosis not present

## 2021-02-27 DIAGNOSIS — F3162 Bipolar disorder, current episode mixed, moderate: Secondary | ICD-10-CM | POA: Diagnosis not present

## 2021-02-27 DIAGNOSIS — F509 Eating disorder, unspecified: Secondary | ICD-10-CM

## 2021-02-27 NOTE — Telephone Encounter (Signed)
Pt had some general questions.   Dietitian answered her questions to her satisfaction.

## 2021-03-12 ENCOUNTER — Ambulatory Visit (INDEPENDENT_AMBULATORY_CARE_PROVIDER_SITE_OTHER): Payer: Medicare HMO | Admitting: Psychology

## 2021-03-12 DIAGNOSIS — F509 Eating disorder, unspecified: Secondary | ICD-10-CM

## 2021-03-20 DIAGNOSIS — F1120 Opioid dependence, uncomplicated: Secondary | ICD-10-CM | POA: Diagnosis not present

## 2021-03-20 DIAGNOSIS — M545 Low back pain, unspecified: Secondary | ICD-10-CM | POA: Diagnosis not present

## 2021-03-20 DIAGNOSIS — Z79899 Other long term (current) drug therapy: Secondary | ICD-10-CM | POA: Diagnosis not present

## 2021-03-20 DIAGNOSIS — G894 Chronic pain syndrome: Secondary | ICD-10-CM | POA: Diagnosis not present

## 2021-03-20 DIAGNOSIS — R030 Elevated blood-pressure reading, without diagnosis of hypertension: Secondary | ICD-10-CM | POA: Diagnosis not present

## 2021-03-25 DIAGNOSIS — B3789 Other sites of candidiasis: Secondary | ICD-10-CM | POA: Diagnosis not present

## 2021-03-25 DIAGNOSIS — L304 Erythema intertrigo: Secondary | ICD-10-CM | POA: Diagnosis not present

## 2021-04-07 ENCOUNTER — Other Ambulatory Visit: Payer: Self-pay

## 2021-04-07 ENCOUNTER — Encounter: Payer: Medicare HMO | Attending: Surgery | Admitting: Skilled Nursing Facility1

## 2021-04-07 DIAGNOSIS — E669 Obesity, unspecified: Secondary | ICD-10-CM | POA: Diagnosis not present

## 2021-04-07 NOTE — Progress Notes (Signed)
Pre-Operative Nutrition Class:    Patient was seen on 04/07/2021 for Pre-Operative Bariatric Surgery Education [facility]    Surgery date: 05/05/2021 Surgery type: Gastric Banding Start weight at [facility]: 276 Weight today: 273  Samples given per MNT protocol. Patient educated on appropriate usage: Ensure max exp: July 23, 2021 Ensure max lot: [id number]  Chewable bariatric advantage: advanced multi EA exp: 08/23 Chewable bariatric advantage: advanced multi EA lot: [id number]  Bariatric advantage calcium citrate exp: 02/23 Bariatric advantage calcium citrate lot: [id number]  The following the learning objectives were met by the patient during this course: Identify Pre-Op Dietary Goals and will begin 2 weeks pre-operatively Identify appropriate sources of fluids and proteins  State protein recommendations and appropriate sources pre and post-operatively Identify Post-Operative Dietary Goals and will follow for 2 weeks post-operatively Identify appropriate multivitamin and calcium sources Describe the need for physical activity post-operatively and will follow MD recommendations State when to call healthcare provider regarding medication questions or post-operative complications When having a diagnosis of diabetes understanding hypoglycemia symptoms and the inclusion of 1 complex carbohydrate per meal  Handouts given during class include: Pre-Op Bariatric Surgery Diet Handout Protein Shake Handout Post-Op Bariatric Surgery Nutrition Handout BELT Program Information Flyer Support Group Information Flyer [facility] Bariatric Supplements Price List  Follow-Up Plan: Patient will follow-up at [facility] 2 weeks post operatively for diet advancement per MD.

## 2021-04-10 DIAGNOSIS — Z6841 Body Mass Index (BMI) 40.0 and over, adult: Secondary | ICD-10-CM | POA: Diagnosis not present

## 2021-04-10 NOTE — H&P (Addendum)
CC Obesity HPI Nancy [name] is a 40 y.o. female who is seen today as an office consultation  for evaluation of Inadequate weight loss despite much effort with diets.  .   She has considered her bariatric options and is interested in Lap-Band surgery. She has a history of lifelong obesity worse as an adult. She has PCOS, prediabetes hyperlipidemia hypothyroidism obstructive sleep apnea arthritis and anxiety. She is seeing Dr. Leafy [name] in the weight loss center and is seen Dr.Vincent [name], psychiatrist.  She has reviewed Lap-Band via the information on their website and is from me with this. I went over the concept with my chart and the placement of this restrictive device up to the upper EG junction. It is a procedure we can do as an outpatient. I explained what we placed the ports and how we fill and remove fluid from the port. I indicated one of the limitations of this is finding physicians that are willing to fill the band. Our office is certainly willing to do that. She has had no prior abdominal surgery. She denies any GERD. She would like to move ahead with the surgery so we will submit her for any requisite studies for her to have a Lap-Band placement. I asked her if they had considered stapled operations including sleeve and bypass and she did not wish to have a stapled operation.    Vitals:     BP: (!) 140/80  Pulse: (!) 112  SpO2: 97%  Weight: (!) 124.2 kg (273 lb 12.8 oz)  Height: 163.8 cm (5' 4.5")    Body mass index is 46.27 kg/m.  Physical Exam General:   Obese white female no acute distress HEENT:  Unremarkable Chest    : Clear to auscultation Heart:   Sinus rhythm without murmurs Breast:   Not examined Abdomen:  Obese with no prior upper abdominal surgery GU   unremarkable Rectal   not performed Extremities  full range of motion Neuro   alert and oriented x3.  Motor and sensory function were not specifically tested  Labs, Imaging and Diagnostic  Testing:  UGI showed no hiatal hernia.  Ultrasound shows no gallstones.    Assessment and Plan:  Diagnoses and all orders for this visit:  Morbid obesity with BMI of 45.0-49.9, adult (CMS-HCC)     For placement of Lapband.  All of her questions have been answered and informed consent obtained.      Nancy [name] Nancy Pounds, MD

## 2021-04-14 NOTE — Progress Notes (Addendum)
PCP - Dr. Donald Prose Cardiologist - no  PPM/ICD -  Device Orders -  Rep Notified -   Chest x-ray -  EKG -  Stress Test -  ECHO -  Cardiac Cath -   Sleep Study -  CPAP - Mild OSA no CPAP  Fasting Blood Sugar -  Checks Blood Sugar _____ times a day  Blood Thinner Instructions: Aspirin Instructions:  ERAS Protcol - PRE-SURGERY G2-   COVID TEST- N/A COVID vaccine -Pfizer x 3  Activity--Some SOB when walking a flight of stairs not new for pt. No CP Anesthesia review: pre DM OSA no cpap  Patient denies shortness of breath, fever, cough and chest pain at PAT appointment   All instructions explained to the patient, with a verbal understanding of the material. Patient agrees to go over the instructions while at home for a better understanding. Patient also instructed to self quarantine after being tested for COVID-19. The opportunity to ask questions was provided.

## 2021-04-14 NOTE — Patient Instructions (Addendum)
DUE TO COVID-19 ONLY ONE VISITOR IS ALLOWED TO COME WITH YOU AND STAY IN THE WAITING ROOM ONLY DURING PRE OP AND PROCEDURE DAY OF SURGERY.   Up to two visitors ages 16+ are allowed at one time in a patient's room.  The visitors may rotate out with other people throughout the day.  Additionally, up to two children between the ages of 72 and 46 are allowed and do not count toward the number of allowed visitors.  Children within this age range must be accompanied by an adult visitor.  One adult visitor may remain with the patient overnight and must be in the room by 8 PM.         Your procedure is scheduled on: 05-05-21   Report to [facility] Main  Entrance   Report to admitting at      [place] AM     Call this number if you have problems the morning of surgery [phone]   Remember: MORNING OF SURGERY DRINK:   DRINK 1 G2 drink BEFORE YOU LEAVE HOME, DRINK ALL OF THE  G2 DRINK AT ONE TIME.   NO SOLID FOOD AFTER 600 PM THE NIGHT BEFORE YOUR SURGERY.   YOU MAY DRINK CLEAR FLUIDS. THE G2 DRINK YOU DRINK BEFORE YOU LEAVE HOME WILL BE THE LAST FLUIDS YOU DRINK BEFORE SURGERY.  PAIN IS EXPECTED AFTER SURGERY AND WILL NOT BE COMPLETELY ELIMINATED. AMBULATION AND TYLENOL WILL HELP REDUCE INCISIONAL AND GAS PAIN. MOVEMENT IS KEY!  YOU ARE EXPECTED TO BE OUT OF BED WITHIN 4 HOURS OF ADMISSION TO YOUR PATIENT ROOM.  SITTING IN THE RECLINER THROUGHOUT THE DAY IS IMPORTANT FOR DRINKING FLUIDS AND MOVING GAS THROUGHOUT THE GI TRACT.  COMPRESSION STOCKINGS SHOULD BE WORN [place] UNLESS YOU ARE WALKING.   INCENTIVE SPIROMETER SHOULD BE USED EVERY HOUR WHILE AWAKE TO DECREASE POST-OPERATIVE COMPLICATIONS SUCH AS PNEUMONIA.  WHEN DISCHARGED HOME, IT IS IMPORTANT TO CONTINUE TO WALK EVERY HOUR AND USE THE INCENTIVE SPIROMETER EVERY HOUR.      CLEAR LIQUID DIET                                                                    water Black Coffee and tea, regular and decaf  No Creamer                            Plain Jell-O any favor except red or purple                                  Fruit ices (not with fruit pulp)                                      Iced Popsicles  Cranberry, grape and apple juices Sports drinks like Gatorade Lightly seasoned clear broth or consume(fat free) Sugar, honey syrup  _____________________________________________________________________           BRUSH YOUR TEETH MORNING OF SURGERY AND RINSE YOUR MOUTH OUT, NO CHEWING GUM CANDY OR MINTS.     Take these medicines the morning of surgery with A SIP OF WATER: methocarbamol, oxycodone, levothyroxine, inhaler bring with you, abilify, gabapentin, lipitorvesicare  DO NOT TAKE ANY DIABETIC MEDICATIONS DAY OF YOUR SURGERY                               You may not have any metal on your body including hair pins and              piercings  Do not wear jewelry, make-up, lotions, powders,perfumes,        deodorant             Do not wear nail polish on your fingernails or toenails .  Do not shave  48 hours prior to surgery.               Do not bring valuables to the hospital. [place].  Contacts, dentures or bridgework may not be worn into surgery.  You may bring a small overnight bag with you     Patients discharged the day of surgery will not be allowed to drive home. IF YOU ARE HAVING SURGERY AND GOING HOME THE SAME DAY, YOU MUST HAVE AN ADULT TO DRIVE YOU HOME AND BE WITH YOU FOR 24 HOURS. YOU MAY GO HOME BY TAXI OR UBER OR ORTHERWISE, BUT AN ADULT MUST ACCOMPANY YOU HOME AND STAY WITH YOU FOR 24 HOURS.  Name and phone number of your driver:  Special Instructions: N/A              Please read over the following fact sheets you were given: _____________________________________________________________________             [facility] - Preparing for  Surgery Before surgery, you can play an important role.  Because skin is not sterile, your skin needs to be as free of germs as possible.  You can reduce the number of germs on your skin by washing with CHG (chlorahexidine gluconate) soap before surgery.  CHG is an antiseptic cleaner which kills germs and bonds with the skin to continue killing germs even after washing. Please DO NOT use if you have an allergy to CHG or antibacterial soaps.  If your skin becomes reddened/irritated stop using the CHG and inform your nurse when you arrive at Short Stay. Do not shave (including legs and underarms) for at least 48 hours prior to the first CHG shower.  You may shave your face/neck. Please follow these instructions carefully:  1.  Shower with CHG Soap the night before surgery and the  morning of Surgery.  2.  If you choose to wash your hair, wash your hair first as usual with your  normal  shampoo.  3.  After you shampoo, rinse your hair and body thoroughly to remove the  shampoo.                           4.  Use CHG as you would any other liquid soap.  You can apply chg directly  to the skin and wash                       Gently with a scrungie or clean washcloth.  5.  Apply the CHG Soap to your body ONLY FROM THE NECK DOWN.   Do not use on face/ open                           Wound or open sores. Avoid contact with eyes, ears mouth and genitals (private parts).                       Wash face,  Genitals (private parts) with your normal soap.             6.  Wash thoroughly, paying special attention to the area where your surgery  will be performed.  7.  Thoroughly rinse your body with warm water from the neck down.  8.  DO NOT shower/wash with your normal soap after using and rinsing off  the CHG Soap.                9.  Pat yourself dry with a clean towel.            10.  Wear clean pajamas.            11.  Place clean sheets on your bed the night of your first shower and do not  sleep with pets. Day  of Surgery : Do not apply any lotions/deodorants the morning of surgery.  Please wear clean clothes to the hospital/surgery center.  FAILURE TO FOLLOW THESE INSTRUCTIONS MAY RESULT IN THE CANCELLATION OF YOUR SURGERY PATIENT SIGNATURE_________________________________  NURSE SIGNATURE__________________________________  ________________________________________________________________________    Adam Phenix  An incentive spirometer is a tool that can help keep your lungs clear and active. This tool measures how well you are filling your lungs with each breath. Taking long deep breaths may help reverse or decrease the chance of developing breathing (pulmonary) problems (especially infection) following: A long period of time when you are unable to move or be active. BEFORE THE PROCEDURE  If the spirometer includes an indicator to show your best effort, your nurse or respiratory therapist will set it to a desired goal. If possible, sit up straight or lean slightly forward. Try not to slouch. Hold the incentive spirometer in an upright position. INSTRUCTIONS FOR USE  Sit on the edge of your bed if possible, or sit up as far as you can in bed or on a chair. Hold the incentive spirometer in an upright position. Breathe out normally. Place the mouthpiece in your mouth and seal your lips tightly around it. Breathe in slowly and as deeply as possible, raising the piston or the ball toward the top of the column. Hold your breath for 3-5 seconds or for as long as possible. Allow the piston or ball to fall to the bottom of the column. Remove the mouthpiece from your mouth and breathe out normally. Rest for a few seconds and repeat Steps 1 through 7 at least 10 times every 1-2 hours when you are awake. Take your time and take a few normal breaths between deep breaths. The spirometer may include an indicator to show your best effort. Use the indicator as a goal to work toward during each  repetition. After each set of 10 deep breaths, practice  coughing to be sure your lungs are clear. If you have an incision (the cut made at the time of surgery), support your incision when coughing by placing a pillow or rolled up towels firmly against it. Once you are able to get out of bed, walk around indoors and cough well. You may stop using the incentive spirometer when instructed by your caregiver.  RISKS AND COMPLICATIONS Take your time so you do not get dizzy or light-headed. If you are in pain, you may need to take or ask for pain medication before doing incentive spirometry. It is harder to take a deep breath if you are having pain. AFTER USE Rest and breathe slowly and easily. It can be helpful to keep track of a log of your progress. Your caregiver can provide you with a simple table to help with this. If you are using the spirometer at home, follow these instructions: [place] IF:  You are having difficultly using the spirometer. You have trouble using the spirometer as often as instructed. Your pain medication is not giving enough relief while using the spirometer. You develop fever of 100.5 F (38.1 C) or higher. SEEK IMMEDIATE MEDICAL CARE IF:  You cough up bloody sputum that had not been present before. You develop fever of 102 F (38.9 C) or greater. You develop worsening pain at or near the incision site. MAKE SURE YOU:  Understand these instructions. Will watch your condition. Will get help right away if you are not doing well or get worse. Document Released: 09/21/2006 Document Revised: 08/03/2011 Document Reviewed: 11/22/2006 [facility] Patient Information 2014 [place], [place].   ________________________________________________________________________

## 2021-04-16 DIAGNOSIS — Z79899 Other long term (current) drug therapy: Secondary | ICD-10-CM | POA: Diagnosis not present

## 2021-04-16 DIAGNOSIS — G894 Chronic pain syndrome: Secondary | ICD-10-CM | POA: Diagnosis not present

## 2021-04-16 DIAGNOSIS — R030 Elevated blood-pressure reading, without diagnosis of hypertension: Secondary | ICD-10-CM | POA: Diagnosis not present

## 2021-04-16 DIAGNOSIS — M545 Low back pain, unspecified: Secondary | ICD-10-CM | POA: Diagnosis not present

## 2021-04-16 DIAGNOSIS — F1120 Opioid dependence, uncomplicated: Secondary | ICD-10-CM | POA: Diagnosis not present

## 2021-04-21 ENCOUNTER — Other Ambulatory Visit: Payer: Self-pay

## 2021-04-21 ENCOUNTER — Encounter (HOSPITAL_COMMUNITY)
Admission: RE | Admit: 2021-04-21 | Discharge: 2021-04-21 | Disposition: A | Discharge status: Home / Self Care | Payer: Medicare HMO | Source: Ambulatory Visit | Attending: Surgery | Admitting: Surgery

## 2021-04-21 ENCOUNTER — Encounter (HOSPITAL_COMMUNITY): Payer: Self-pay

## 2021-04-21 VITALS — BP 136/92 | HR 92 | Temp 98.2°F | Resp 16 | Ht 64.0 in | Wt 273.0 lb

## 2021-04-21 DIAGNOSIS — R7303 Prediabetes: Secondary | ICD-10-CM | POA: Diagnosis not present

## 2021-04-21 DIAGNOSIS — Z01818 Encounter for other preprocedural examination: Secondary | ICD-10-CM | POA: Insufficient documentation

## 2021-04-21 HISTORY — DX: Prediabetes: R73.03

## 2021-04-21 LAB — TYPE AND SCREEN
ABO/RH(D): A POS
Antibody Screen: NEGATIVE

## 2021-04-21 LAB — COMPREHENSIVE METABOLIC PANEL
ALT: 44 U/L (ref 0–44)
AST: 44 U/L — ABNORMAL HIGH (ref 15–41)
Albumin: 3.9 g/dL (ref 3.5–5.0)
Alkaline Phosphatase: 57 U/L (ref 38–126)
Anion gap: 9 (ref 5–15)
BUN: 11 mg/dL (ref 6–20)
CO2: 26 mmol/L (ref 22–32)
Calcium: 9.4 mg/dL (ref 8.9–10.3)
Chloride: 102 mmol/L (ref 98–111)
Creatinine, Ser: 0.69 mg/dL (ref 0.44–1.00)
GFR, Estimated: >60 mL/min (ref >60)
Glucose, Bld: 103 mg/dL — ABNORMAL HIGH (ref 70–99)
Potassium: 4.6 mmol/L (ref 3.5–5.1)
Sodium: 137 mmol/L (ref 135–145)
Total Bilirubin: 0.6 mg/dL (ref 0.3–1.2)
Total Protein: 7.7 g/dL (ref 6.5–8.1)

## 2021-04-21 LAB — CBC WITH DIFFERENTIAL/PLATELET
Abs Immature Granulocytes: 0.03 10*3/uL (ref 0.00–0.07)
Basophils Absolute: 0.1 10*3/uL (ref 0.0–0.1)
Basophils Relative: 1 %
Eosinophils Absolute: 0.2 10*3/uL (ref 0.0–0.5)
Eosinophils Relative: 2 %
HCT: 42.8 % (ref 36.0–46.0)
Hemoglobin: 14.1 g/dL (ref 12.0–15.0)
Immature Granulocytes: 0 %
Lymphocytes Relative: 23 %
Lymphs Abs: 1.7 10*3/uL (ref 0.7–4.0)
MCH: 29.4 pg (ref 26.0–34.0)
MCHC: 32.9 g/dL (ref 30.0–36.0)
MCV: 89.2 fL (ref 80.0–100.0)
Monocytes Absolute: 0.5 10*3/uL (ref 0.1–1.0)
Monocytes Relative: 7 %
Neutro Abs: 5 10*3/uL (ref 1.7–7.7)
Neutrophils Relative %: 67 %
Platelets: 175 10*3/uL (ref 150–400)
RBC: 4.8 MIL/uL (ref 3.87–5.11)
RDW: 15 % (ref 11.5–15.5)
WBC: 7.5 10*3/uL (ref 4.0–10.5)
nRBC: 0 % (ref 0.0–0.2)

## 2021-04-21 LAB — GLUCOSE, CAPILLARY: Glucose-Capillary: 110 mg/dL — ABNORMAL HIGH (ref 70–99)

## 2021-04-22 LAB — HEMOGLOBIN A1C
Hgb A1c MFr Bld: 6.5 % — ABNORMAL HIGH (ref 4.8–5.6)
Mean Plasma Glucose: 140 mg/dL

## 2021-05-05 ENCOUNTER — Ambulatory Visit (HOSPITAL_COMMUNITY): Payer: Medicare HMO | Admitting: Anesthesiology

## 2021-05-05 ENCOUNTER — Ambulatory Visit (HOSPITAL_COMMUNITY): Payer: Medicare HMO

## 2021-05-05 ENCOUNTER — Encounter (HOSPITAL_COMMUNITY): Payer: Self-pay | Admitting: Surgery

## 2021-05-05 ENCOUNTER — Ambulatory Visit (HOSPITAL_COMMUNITY)
Admission: RE | Admit: 2021-05-05 | Discharge: 2021-05-05 | Disposition: A | Discharge status: Home / Self Care | Payer: Medicare HMO | Source: Ambulatory Visit | Attending: Surgery | Admitting: Surgery

## 2021-05-05 ENCOUNTER — Encounter (HOSPITAL_COMMUNITY)
Admission: RE | Disposition: A | Discharge status: Home / Self Care | Payer: Self-pay | Source: Ambulatory Visit | Attending: Surgery

## 2021-05-05 DIAGNOSIS — E7800 Pure hypercholesterolemia, unspecified: Secondary | ICD-10-CM | POA: Diagnosis not present

## 2021-05-05 DIAGNOSIS — R112 Nausea with vomiting, unspecified: Secondary | ICD-10-CM | POA: Insufficient documentation

## 2021-05-05 DIAGNOSIS — Z6841 Body Mass Index (BMI) 40.0 and over, adult: Secondary | ICD-10-CM | POA: Diagnosis not present

## 2021-05-05 DIAGNOSIS — Z09 Encounter for follow-up examination after completed treatment for conditions other than malignant neoplasm: Secondary | ICD-10-CM

## 2021-05-05 DIAGNOSIS — R7303 Prediabetes: Secondary | ICD-10-CM | POA: Diagnosis not present

## 2021-05-05 DIAGNOSIS — Z9884 Bariatric surgery status: Secondary | ICD-10-CM | POA: Diagnosis not present

## 2021-05-05 DIAGNOSIS — K219 Gastro-esophageal reflux disease without esophagitis: Secondary | ICD-10-CM | POA: Diagnosis not present

## 2021-05-05 DIAGNOSIS — E282 Polycystic ovarian syndrome: Secondary | ICD-10-CM | POA: Diagnosis not present

## 2021-05-05 DIAGNOSIS — G4733 Obstructive sleep apnea (adult) (pediatric): Secondary | ICD-10-CM | POA: Diagnosis not present

## 2021-05-05 HISTORY — PX: LAPAROSCOPIC GASTRIC BANDING: SHX1100

## 2021-05-05 LAB — PREGNANCY, URINE: Preg Test, Ur: NEGATIVE

## 2021-05-05 LAB — GLUCOSE, CAPILLARY: Glucose-Capillary: 108 mg/dL — ABNORMAL HIGH (ref 70–99)

## 2021-05-05 LAB — ABO/RH: ABO/RH(D): A POS

## 2021-05-05 SURGERY — GASTRIC BANDING, LAPAROSCOPIC
Anesthesia: General | Site: Abdomen

## 2021-05-05 MED ORDER — ONDANSETRON 4 MG PO TBDP: 4.0000 mg | ORAL_TABLET | Freq: Four times a day (QID) | ORAL | 0 refills | Status: AC | PRN

## 2021-05-05 MED ORDER — BUPIVACAINE-EPINEPHRINE (PF) 0.25% -1:200000 IJ SOLN
INTRAMUSCULAR | Status: AC
  Filled 2021-05-05: qty 30

## 2021-05-05 MED ORDER — OXYCODONE HCL 5 MG PO TABS: 5.0000 mg | ORAL_TABLET | Freq: Four times a day (QID) | ORAL | 0 refills | Status: AC | PRN

## 2021-05-05 MED ORDER — CHLORHEXIDINE GLUCONATE CLOTH 2 % EX PADS: 6.0000 | MEDICATED_PAD | Freq: Once | CUTANEOUS | Status: DC

## 2021-05-05 MED ORDER — ONDANSETRON HCL 4 MG/2ML IJ SOLN
INTRAMUSCULAR | Status: AC
  Filled 2021-05-05: qty 2

## 2021-05-05 MED ORDER — SODIUM CHLORIDE 0.9 % IV SOLN
INTRAVENOUS | Status: AC
  Filled 2021-05-05: qty 2

## 2021-05-05 MED ORDER — PHENYLEPHRINE 40 MCG/ML (10ML) SYRINGE FOR IV PUSH (FOR BLOOD PRESSURE SUPPORT)
PREFILLED_SYRINGE | INTRAVENOUS | Status: DC | PRN
  Administered 2021-05-05 (×6): 80 ug via INTRAVENOUS

## 2021-05-05 MED ORDER — ROCURONIUM BROMIDE 10 MG/ML (PF) SYRINGE
PREFILLED_SYRINGE | INTRAVENOUS | Status: AC
  Filled 2021-05-05: qty 10

## 2021-05-05 MED ORDER — FENTANYL CITRATE PF 50 MCG/ML IJ SOSY
25.0000 ug | PREFILLED_SYRINGE | INTRAMUSCULAR | Status: DC | PRN
  Administered 2021-05-05 (×2): 50 ug via INTRAVENOUS

## 2021-05-05 MED ORDER — FENTANYL CITRATE (PF) 100 MCG/2ML IJ SOLN
INTRAMUSCULAR | Status: DC | PRN
  Administered 2021-05-05: 100 ug via INTRAVENOUS

## 2021-05-05 MED ORDER — DEXAMETHASONE SODIUM PHOSPHATE 4 MG/ML IJ SOLN
INTRAMUSCULAR | Status: DC | PRN
  Administered 2021-05-05: 6 mg via INTRAVENOUS

## 2021-05-05 MED ORDER — MIDAZOLAM HCL 5 MG/5ML IJ SOLN
INTRAMUSCULAR | Status: DC | PRN
  Administered 2021-05-05: 2 mg via INTRAVENOUS

## 2021-05-05 MED ORDER — AMISULPRIDE (ANTIEMETIC) 5 MG/2ML IV SOLN
INTRAVENOUS | Status: AC
  Administered 2021-05-05: 10 mg via INTRAVENOUS
  Filled 2021-05-05: qty 2

## 2021-05-05 MED ORDER — OXYCODONE HCL 5 MG/5ML PO SOLN: 5.0000 mg | Freq: Once | ORAL | Status: AC | PRN

## 2021-05-05 MED ORDER — BUPIVACAINE LIPOSOME 1.3 % IJ SUSP: 20.0000 mL | Freq: Once | INTRAMUSCULAR | Status: DC

## 2021-05-05 MED ORDER — EPHEDRINE SULFATE-NACL 50-0.9 MG/10ML-% IV SOSY
PREFILLED_SYRINGE | INTRAVENOUS | Status: DC | PRN
  Administered 2021-05-05: 10 mg via INTRAVENOUS

## 2021-05-05 MED ORDER — PROPOFOL 10 MG/ML IV BOLUS
INTRAVENOUS | Status: DC | PRN
  Administered 2021-05-05: 200 mg via INTRAVENOUS

## 2021-05-05 MED ORDER — BUPIVACAINE LIPOSOME 1.3 % IJ SUSP
INTRAMUSCULAR | Status: DC | PRN
  Administered 2021-05-05: 20 mL

## 2021-05-05 MED ORDER — OXYCODONE HCL 5 MG PO TABS
ORAL_TABLET | ORAL | Status: AC
  Administered 2021-05-05: 5 mg via ORAL
  Filled 2021-05-05: qty 1

## 2021-05-05 MED ORDER — OXYCODONE HCL 5 MG PO TABS: 5.0000 mg | ORAL_TABLET | Freq: Once | ORAL | Status: AC | PRN

## 2021-05-05 MED ORDER — DEXAMETHASONE SODIUM PHOSPHATE 10 MG/ML IJ SOLN
INTRAMUSCULAR | Status: AC
  Filled 2021-05-05: qty 1

## 2021-05-05 MED ORDER — ONDANSETRON HCL 4 MG/2ML IJ SOLN: 4.0000 mg | Freq: Once | INTRAMUSCULAR | Status: DC | PRN

## 2021-05-05 MED ORDER — ONDANSETRON HCL 4 MG/2ML IJ SOLN
INTRAMUSCULAR | Status: DC | PRN
  Administered 2021-05-05: 4 mg via INTRAVENOUS

## 2021-05-05 MED ORDER — DEXMEDETOMIDINE (PRECEDEX) IN NS 20 MCG/5ML (4 MCG/ML) IV SYRINGE
PREFILLED_SYRINGE | INTRAVENOUS | Status: DC | PRN
  Administered 2021-05-05 (×2): 8 ug via INTRAVENOUS

## 2021-05-05 MED ORDER — SODIUM CHLORIDE 0.9 % IV SOLN
2.0000 g | INTRAVENOUS | Status: AC
  Administered 2021-05-05: 2 g via INTRAVENOUS
  Filled 2021-05-05: qty 2

## 2021-05-05 MED ORDER — LACTATED RINGERS IV SOLN: INTRAVENOUS | Status: DC

## 2021-05-05 MED ORDER — SCOPOLAMINE 1 MG/3DAYS TD PT72
1.0000 | MEDICATED_PATCH | TRANSDERMAL | Status: DC
  Administered 2021-05-05: 1.5 mg via TRANSDERMAL
  Filled 2021-05-05: qty 1

## 2021-05-05 MED ORDER — LIDOCAINE HCL (CARDIAC) PF 100 MG/5ML IV SOSY
PREFILLED_SYRINGE | INTRAVENOUS | Status: DC | PRN
  Administered 2021-05-05: 60 mg via INTRAVENOUS

## 2021-05-05 MED ORDER — ESMOLOL HCL 100 MG/10ML IV SOLN
INTRAVENOUS | Status: DC | PRN
  Administered 2021-05-05: 20 mg via INTRAVENOUS

## 2021-05-05 MED ORDER — LIDOCAINE HCL (PF) 2 % IJ SOLN
INTRAMUSCULAR | Status: DC | PRN
  Administered 2021-05-05: 1.5 mg/kg/h via INTRADERMAL

## 2021-05-05 MED ORDER — ACETAMINOPHEN 500 MG PO TABS
1000.0000 mg | ORAL_TABLET | ORAL | Status: AC
  Administered 2021-05-05: 1000 mg via ORAL
  Filled 2021-05-05: qty 2

## 2021-05-05 MED ORDER — MIDAZOLAM HCL 2 MG/2ML IJ SOLN
INTRAMUSCULAR | Status: AC
  Filled 2021-05-05: qty 2

## 2021-05-05 MED ORDER — SUGAMMADEX SODIUM 200 MG/2ML IV SOLN
INTRAVENOUS | Status: DC | PRN
  Administered 2021-05-05: 350 mg via INTRAVENOUS

## 2021-05-05 MED ORDER — ORAL CARE MOUTH RINSE
15.0000 mL | Freq: Once | OROMUCOSAL | Status: AC
  Administered 2021-05-05: 15 mL via OROMUCOSAL

## 2021-05-05 MED ORDER — HEPARIN SODIUM (PORCINE) 5000 UNIT/ML IJ SOLN
5000.0000 [IU] | INTRAMUSCULAR | Status: AC
  Administered 2021-05-05: 5000 [IU] via SUBCUTANEOUS
  Filled 2021-05-05: qty 1

## 2021-05-05 MED ORDER — FENTANYL CITRATE (PF) 250 MCG/5ML IJ SOLN
INTRAMUSCULAR | Status: AC
  Filled 2021-05-05: qty 5

## 2021-05-05 MED ORDER — CHLORHEXIDINE GLUCONATE 0.12 % MT SOLN: 15.0000 mL | Freq: Once | OROMUCOSAL | Status: AC

## 2021-05-05 MED ORDER — EPHEDRINE 5 MG/ML INJ
INTRAVENOUS | Status: AC
  Filled 2021-05-05: qty 5

## 2021-05-05 MED ORDER — APREPITANT 40 MG PO CAPS: 40.0000 mg | ORAL_CAPSULE | ORAL | Status: AC

## 2021-05-05 MED ORDER — SODIUM CHLORIDE (PF) 0.9 % IJ SOLN
INTRAMUSCULAR | Status: DC | PRN
  Administered 2021-05-05: 20 mL
  Administered 2021-05-05: 10 mL

## 2021-05-05 MED ORDER — DEXMEDETOMIDINE (PRECEDEX) IN NS 20 MCG/5ML (4 MCG/ML) IV SYRINGE
PREFILLED_SYRINGE | INTRAVENOUS | Status: AC
  Filled 2021-05-05: qty 5

## 2021-05-05 MED ORDER — AMISULPRIDE (ANTIEMETIC) 5 MG/2ML IV SOLN: 10.0000 mg | Freq: Once | INTRAVENOUS | Status: AC | PRN

## 2021-05-05 MED ORDER — LIDOCAINE HCL (PF) 2 % IJ SOLN
INTRAMUSCULAR | Status: AC
  Filled 2021-05-05: qty 20

## 2021-05-05 MED ORDER — LACTATED RINGERS IV SOLN
INTRAVENOUS | Status: DC | PRN
  Administered 2021-05-05: 1000 mL

## 2021-05-05 MED ORDER — ROCURONIUM BROMIDE 10 MG/ML (PF) SYRINGE
PREFILLED_SYRINGE | INTRAVENOUS | Status: DC | PRN
  Administered 2021-05-05: 20 mg via INTRAVENOUS
  Administered 2021-05-05: 80 mg via INTRAVENOUS

## 2021-05-05 MED ORDER — APREPITANT 40 MG PO CAPS
ORAL_CAPSULE | ORAL | Status: AC
  Administered 2021-05-05: 40 mg via ORAL
  Filled 2021-05-05: qty 1

## 2021-05-05 MED ORDER — FENTANYL CITRATE PF 50 MCG/ML IJ SOSY
PREFILLED_SYRINGE | INTRAMUSCULAR | Status: AC
  Administered 2021-05-05: 50 ug via INTRAVENOUS
  Filled 2021-05-05: qty 3

## 2021-05-05 MED ORDER — BUPIVACAINE LIPOSOME 1.3 % IJ SUSP
INTRAMUSCULAR | Status: AC
  Filled 2021-05-05: qty 20

## 2021-05-05 SURGICAL SUPPLY — 56 items
BAG COUNTER SPONGE SURGICOUNT (BAG) IMPLANT
BAND LAP 10.0 W/TUBES (Band) ×1 IMPLANT
BENZOIN TINCTURE PRP APPL 2/3 (GAUZE/BANDAGES/DRESSINGS) IMPLANT
BLADE SURG 15 STRL LF DISP TIS (BLADE) ×1 IMPLANT
BLADE SURG 15 STRL SS (BLADE) ×1
COVER SURGICAL LIGHT HANDLE (MISCELLANEOUS) ×2 IMPLANT
DECANTER SPIKE VIAL GLASS SM (MISCELLANEOUS) ×4 IMPLANT
DERMABOND ADVANCED (GAUZE/BANDAGES/DRESSINGS) ×1
DERMABOND ADVANCED .7 DNX12 (GAUZE/BANDAGES/DRESSINGS) ×1 IMPLANT
DEVICE SUT QUICK LOAD TK 5 (STAPLE) ×6 IMPLANT
DEVICE SUT TI-KNOT TK 5X26 (MISCELLANEOUS) ×2 IMPLANT
DEVICE SUTURE ENDOST 10MM (ENDOMECHANICALS) IMPLANT
DISSECTOR BLUNT TIP ENDO 5MM (MISCELLANEOUS) IMPLANT
ELECT REM PT RETURN 15FT ADLT (MISCELLANEOUS) ×2 IMPLANT
GAUZE SPONGE 4X4 12PLY STRL (GAUZE/BANDAGES/DRESSINGS) ×2 IMPLANT
GLOVE SURG ENC TEXT LTX SZ8 (GLOVE) ×2 IMPLANT
GOWN SPEC L4 XLG W/TWL (GOWN DISPOSABLE) ×2 IMPLANT
GOWN STRL REUS W/TWL XL LVL3 (GOWN DISPOSABLE) ×6 IMPLANT
IRRIG SUCT STRYKERFLOW 2 WTIP (MISCELLANEOUS)
IRRIGATION SUCT STRKRFLW 2 WTP (MISCELLANEOUS) IMPLANT
KIT BASIN OR (CUSTOM PROCEDURE TRAY) ×2 IMPLANT
KIT TURNOVER KIT A (KITS) IMPLANT
MAT PREVALON FULL STRYKER (MISCELLANEOUS) ×2 IMPLANT
MESH HERNIA 3X6 (Mesh General) ×1 IMPLANT
NDL SPNL 22GX3.5 QUINCKE BK (NEEDLE) ×1 IMPLANT
NEEDLE SPNL 22GX3.5 QUINCKE BK (NEEDLE) ×2 IMPLANT
PACK UNIVERSAL I (CUSTOM PROCEDURE TRAY) ×2 IMPLANT
PENCIL SMOKE EVACUATOR (MISCELLANEOUS) IMPLANT
SCISSORS LAP 5X45 EPIX DISP (ENDOMECHANICALS) IMPLANT
SET TUBE SMOKE EVAC HIGH FLOW (TUBING) ×2 IMPLANT
SHEARS HARMONIC ACE PLUS 45CM (MISCELLANEOUS) IMPLANT
SLEEVE ADV FIXATION 5X100MM (TROCAR) IMPLANT
SOL ANTI FOG 6CC (MISCELLANEOUS) ×1 IMPLANT
SOLUTION ANTI FOG 6CC (MISCELLANEOUS) ×1
SPONGE T-LAP 18X18 ~~LOC~~+RFID (SPONGE) ×2 IMPLANT
STAPLER VISISTAT 35W (STAPLE) ×2 IMPLANT
STRIP CLOSURE SKIN 1/2X4 (GAUZE/BANDAGES/DRESSINGS) IMPLANT
SUT ETHIBOND 2 0 SH (SUTURE) ×3
SUT ETHIBOND 2 0 SH 36X2 (SUTURE) ×3 IMPLANT
SUT PROLENE 2 0 CT2 30 (SUTURE) ×4 IMPLANT
SUT SILK 0 (SUTURE) ×1
SUT SILK 0 30XBRD TIE 6 (SUTURE) ×1 IMPLANT
SUT SURGIDAC NAB ES-9 0 48 120 (SUTURE) IMPLANT
SUT VIC AB 2-0 SH 27 (SUTURE)
SUT VIC AB 2-0 SH 27X BRD (SUTURE) IMPLANT
SUT VIC AB 4-0 SH 18 (SUTURE) ×2 IMPLANT
SYR 20ML LL LF (SYRINGE) ×4 IMPLANT
TOWEL OR 17X26 10 PK STRL BLUE (TOWEL DISPOSABLE) ×4 IMPLANT
TOWEL OR NON WOVEN STRL DISP B (DISPOSABLE) ×2 IMPLANT
TROCAR ADV FIXATION 12X100MM (TROCAR) ×2 IMPLANT
TROCAR ADV FIXATION 5X100MM (TROCAR) ×2 IMPLANT
TROCAR BLADELESS 15MM (ENDOMECHANICALS) ×2 IMPLANT
TROCAR BLADELESS OPT 5 100 (ENDOMECHANICALS) ×2 IMPLANT
TROCAR XCEL NON-BLD 11X100MML (ENDOMECHANICALS) ×1 IMPLANT
TROCAR XCEL UNIV SLVE 11M 100M (ENDOMECHANICALS) IMPLANT
TUBE CALIBRATION LAPBAND (TUBING) ×2 IMPLANT

## 2021-05-05 NOTE — Discharge Instructions (Signed)
 ADJUSTABLE GASTRIC BAND Home Care Instructions  These instructions are to help you care for yourself when you go home.  CALL: If you have any problems. Call [phone] and ask for the surgeon on call If you need immediate assistance come to the ER at [facility] [place].  Tell the ER staff that you are a new post-op gastric banding patient   SIGNS AND SYMPTOMS TO REPORT: Severe vomiting or nausea If you cannot keep down clear liquids for longer than 1 day, call your surgeon  Stomach pain that does not get better after taking your pain medication Fever over 100.4 F with chills Heart beating over 100 beats a minute Shortness of breath at rest Chest pain  Redness, swelling, drainage, or foul odor at incision (surgical) sites  If your incisions open or pull apart Swelling or pain in calf (lower leg) Diarrhea (Loose bowel movements that happen often), frequent watery, uncontrolled bowel movements Constipation, (no bowel movements for 3 days) if this happens:  Pick one Take Milk of Magnesia, 2 tablespoons by mouth, 3 times a day for 2 days if needed Stop taking Milk of Magnesia once you have a bowel movement Call your doctor if constipation continues Or Take Miralax  (instead of Milk of Magnesia) following the label instructions Stop taking Miralax once you have a bowel movement Call your doctor if constipation continues Anything you think is "abnormal for you"   NORMAL SIDE EFFECTS AFTER SURGERY: Unable to sleep at night or unable to concentrate/focus Irritability/moody Being tearful (crying) or depressed These are common complaints, possibly related to your anesthesia meds used to put you to sleep, stress of surgery, and change in lifestyle, that usually go away a few weeks after surgery.  If these feelings continue, call your primary care doctor.   WOUND CARE: You may have surgical glue, steri-strips, or staples over your incisions after surgery Surgical glue:  Looks like a clear  film over your incisions and will wear off a little at a time Steri-strips : Strips of tape over your incisions. You may notice a yellowish color on the skin under the steri-strips. This is used to make the   steri-strips stick better. Do not pull the steri-strips off - let them fall off Staples: Staples may be removed before you leave the hospital If you go home with staples, call [facility] at for an appointment with your surgeon's nurse to have staples removed 10 days after surgery, [phone] Showering: You may shower two (2) days after your surgery unless your surgeon tells you differently Wash gently around incisions with warm soapy water, rinse well, and gently pat dry  No tub baths until staples are removed, steri-strips fall off or glue is gone.    MEDICATIONS: Medications should be liquid or crushed if larger than the size of a dime Extended release pills (medication that releases a little bit at a time through the day) should not be crushed or cut (Examples include XL. ER, DR, or SR) Depending on the size and number of medications you take, you may need to space (take a few throughout the day)/change the time you take your medications so that you do not over-fill your pouch (smaller stomach) Make sure you follow-up with your primary care doctor to make medication changes needed during rapid weight loss and life-style changes If you have diabetes, follow up with the doctor that orders your diabetes medication(s) within one week after surgery and check your blood sugar regularly. Do not   drive while taking prescription pain medications It is ok to take Tylenol by the bottle instructions with your pain medicine or instead of your pain medicine as needed.  DO NOT TAKE NSAIDS (EXAMPLES OF NSAIDS:  IBUPROFREN/ NAPROXEN)  DIET:                    FIRST 2 WEEKS  You will see the dietician about two (2) weeks after your surgery. The dietician will increase the types of foods you  can eat if you are handling liquids well: If you have severe vomiting or nausea and cannot keep down clear liquids lasting longer than 1 day, call your surgeon @ ([phone]) For Same Day Surgery Discharge Patients:  The day of surgery drink water only: 2 ounces every 4 hours If you are handling water, start drinking your high protein shake the next morning For Overnight Stay Patients:  Begin by drinking 2 ounces of a high protein every 3 hours, 5 - 6 times per day Slowly increase the amount you drink as tolerated You may find it easier to slowly sip shakes throughout the day.  It is important to get your proteins in first Protein Shake Drink at least 2 ounces of shake 5-6 times per day Each serving of protein shakes (usually 8 - 12 ounces) should have a minimum of:  15 grams of protein  And no more than 5 grams of carbohydrate  Goal for protein each day: Men = 80 grams per day Women = 60 grams per day Protein powder may be added to fluids such as non-fat milk or Lactaid milk or Soy/Almond milk (limit to 35 grams added protein powder per serving)  Hydration Slowly increase the amount of water and other clear liquids as tolerated (See Acceptable Fluids) Slowly increase the amount of protein shake as tolerated   Sip fluids slowly and throughout the day. Do not use straws. May use sugar substitutes in small amounts (no more than 6 - 8 packets per day; i.e. Splenda)  Fluid Goal The first goal is to drink at least 8 ounces of protein shake/drink per day (or as directed by the nutritionist); some examples of protein shakes are Syntrax Nectar, Adkins Advantage, EAS Edge HP, Premiere and Unjury. See handout from pre-op Bariatric Education Class: Slowly increase the amount of protein shake you drink as tolerated You may find it easier to slowly sip shakes throughout the day It is important to get your proteins in first Your fluid goal is to drink 64 - 100 ounces of fluid daily It may take a  few weeks to build up to this 32 oz (or more) should be clear liquids  And  32 oz (or more) should be full liquids (see below for examples) Liquids should not contain sugar, caffeine, or carbonation  Clear Liquids: Water or Sugar-free flavored water (i.e. Fruit H2O, Propel) Decaffeinated coffee or tea (sugar-free) Jamiaya Bina Lite, Wyler's Lite, Minute Maid Lite Sugar-free Jell-O Bouillon or broth Sugar-free Popsicle:   *Less than 20 calories each; Limit 1 per day        Full Liquids: Protein Shakes/Drinks + 2 choices per day of other full liquids Full liquids must be: No More Than 15 grams of Carbs per serving  No More Than 3 grams of Fat per serving Strained low-fat cream soup (except Cream of Potato or Tomato) Non-Fat milk Fat-free Lactaid Milk Unsweetened Soy Or Unsweetened Almond Milk Low Sugar yogurt (Dannon Lite & Fit, Greek yogurt; Oikos Triple Zero; Chobani   Simply 100; Yoplait 100 calorie Greek - No Fruit on the Bottom)   VITAMINS AND MINERALS: Start 1 day after surgery unless otherwise directed by your surgeon Chewable Bariatric Specific Multivitamin / Multimineral Supplement with iron (Example: Bariatric Advantage Multi EA) Chewable Calcium with Vitamin D-3      (Example: 3 Chewable Calcium Plus 600 with Vitamin D-3) Take 500 mg three (3) times a day for a total of 1500 mg each day Do not take all 3 doses of calcium at one time as it may cause constipation,    and you can only absorb 500 mg  at a time  Do not mix multivitamins containing iron with calcium supplements; take 2 hours apart Menstruating women and those with a history of anemia (a blood disease that causes weakness) may need extra iron Talk with your doctor to see if you need more iron Do not stop taking or change any vitamins or minerals until you talk to your dietitian or surgeon Your Dietitian and/or surgeon must approve all vitamin and mineral supplements  ACTIVITY AND EXERCISE: Limit your physical activity  as instructed by your doctor. It is important to continue walking at home. During this time, use these guidelines: Do not lift anything greater than ten (10) pounds for at least two (2) weeks Do not go back to work or drive until your surgeon says you can You may have sex when you feel comfortable  It is VERY important for female patients to use a reliable birth control method; fertility often increases after surgery All hormonal birth control will be ineffective for 30 days after surgery due to medications given during surgery a barrier method must be used. Do not get pregnant for at least 18 months Start exercising as soon as your doctor tells you that you can Make sure your doctor approves any physical activity Start with a simple walking program Walk 5-15 minutes each day, 7 days per week.  Slowly increase until you are walking 30-45 minutes per day Consider joining our BELT program. [phone] or email    SPECIAL INSTRUCTIONS: Things to remember: Use your CPAP when sleeping if this applies to you [facility] offers a free Bariatric Surgery Support Group that meets monthly The 3rd Thursday evening of each month from 6:00 pm to 7:00 pm Meeting dates and location details are provided in a separate document It is very important to keep all follow up appointments with your surgeon, dietitian, primary care physician, and behavioral health practitioner Routine follow-up schedule with your surgeon include appointments at 2-3 weeks, 6-8 weeks, 6 months, and 1 year at a minimum. Your surgeon may request to see you more often.   After the first year, please follow up with your bariatric surgeon and dietitian            at least once a year in order to maintain best weight loss results [facility]: [phone] Nutrition & Education Services: [phone] ([place]) / [phone] ([place]) Bariatric Nurse Coordinator: [phone]   Reviewed and Endorsed  by [facility] Patient Education Committee, Jan, 2014  

## 2021-05-05 NOTE — Anesthesia Postprocedure Evaluation (Signed)
Anesthesia Post Note  Patient: Nancy [name]  Procedure(s) Performed: LAPAROSCOPIC GASTRIC BANDING (Abdomen)     Patient location during evaluation: PACU Anesthesia Type: General Level of consciousness: awake and alert Pain management: pain level controlled Vital Signs Assessment: post-procedure vital signs reviewed and stable Respiratory status: spontaneous breathing, nonlabored ventilation and respiratory function stable Cardiovascular status: blood pressure returned to baseline and stable Postop Assessment: no apparent nausea or vomiting Anesthetic complications: no   No notable events documented.  Last Vitals:  Vitals:   05/05/21 1330 05/05/21 1345  BP: 128/75 126/72  Pulse: 94 90  Resp: 11 14  Temp:    SpO2: 94% 94%    Last Pain:  Vitals:   05/05/21 1345  PainSc: 3                  Lidia Collum

## 2021-05-05 NOTE — Interval H&P Note (Signed)
History and Physical Interval Note:  05/05/2021 9:39 AM  Nancy [name]  has presented today for surgery, with the diagnosis of MORBID OBESITY.  The various methods of treatment have been discussed with the patient and family. After consideration of risks, benefits and other options for treatment, the patient has consented to  Procedure(s): LAPAROSCOPIC GASTRIC BANDING (N/A) as a surgical intervention.  The patient's history has been reviewed, patient examined, no change in status, stable for surgery.  I have reviewed the patient's chart and labs.  Questions were answered to the patient's satisfaction.     Pedro Earls
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
## 2021-05-05 NOTE — Anesthesia Procedure Notes (Signed)
Procedure Name: Intubation Date/Time: 05/05/2021 10:37 AM Performed by: Lieutenant Diego, CRNA Pre-anesthesia Checklist: Patient identified, Emergency Drugs available, Suction available and Patient being monitored Patient Re-evaluated:Patient Re-evaluated prior to induction Oxygen Delivery Method: Circle system utilized Preoxygenation: Pre-oxygenation with 100% oxygen Induction Type: IV induction Ventilation: Mask ventilation without difficulty Laryngoscope Size: Miller and 2 Grade View: Grade I Tube type: Oral Tube size: 7.0 mm Number of attempts: 1 Airway Equipment and Method: Stylet Placement Confirmation: ETT inserted through vocal cords under direct vision, positive ETCO2 and breath sounds checked- equal and bilateral Secured at: 23 cm Tube secured with: Tape Dental Injury: Teeth and Oropharynx as per pre-operative assessment

## 2021-05-05 NOTE — Op Note (Signed)
05/05/2021  Surgeon: Kaylyn Lim, MD, FACS Asst:  Gurney Maxin, MD, FACS  Procedure: Laparoscopic adjustable gastric banding with APS lapband  Anes:  General  EBL:  Minimal  Description of Procedure  The patient was taken to OR # 2 and given general anesthesia.  After a prep with chloroprep the patient was draped and a timeout performed.  Access to the abdomen was achieved with a 0 degree Optiview technique through the left upper quadrant.    Adhesions were minmal.  Ports were placed to the the right of the midline including a 15 trocar in  the right upper quadrant placed obliquely.  The Sherrie Sport was used to retract the left lateral segment and the peritoneum was incised along the left crus.   The EJ junction as assessed for a hiatus hernia and none was seen.  A balloon test was not performed.    The pars flaccida technique was utilized to insert the blunt "finger " dissector from right to left behind the stomach.  This created a target zone to pass the band passer.     The lapband   Had been previously flushed and was inserted through the 15 trocar.  It was placed in the tip of "the finger"  and pulled around behind the stomach.   The band was plicated with three sutures of 2-0 Surgidek secured with TyKnots.  The tubing was brought out through the lower incision on the right and connected to the port which had mesh sewn onto the back and was placed into the subcutaneous pocket.  Vicryl was used to secure the port stem and it had a nice flat lie.  The incisions were injected with Exparel as a TAP block at the beginning of the case and closed with 4-0 Monocryl and Dermabond.     The patient was taken to the PACU in stable condition.    Matt B. Hassell Done, [place], [facility], [place]

## 2021-05-05 NOTE — Interval H&P Note (Signed)
History and Physical Interval Note:  05/05/2021 9:37 AM  Nancy [name]  has presented today for surgery, with the diagnosis of MORBID OBESITY.  The various methods of treatment have been discussed with the patient and family. After consideration of risks, benefits and other options for treatment, the patient has consented to  Procedure(s): LAPAROSCOPIC GASTRIC BANDING (N/A) as a surgical intervention.  The patient's history has been reviewed, patient examined, no change in status, stable for surgery.  I have reviewed the patient's chart and labs.  Questions were answered to the patient's satisfaction.     Pedro Earls

## 2021-05-05 NOTE — Progress Notes (Signed)
Patient is alert and oriented.  Pain is controlled, and patient is tolerating fluids.    Reviewed Adjustable gastric band discharge instructions with patient, patient able to articulate understanding.  Provided information on BELT program, Support Group and [facility] outpatient pharmacy. All questions answered, will continue to monitor.  

## 2021-05-05 NOTE — Anesthesia Preprocedure Evaluation (Signed)
Anesthesia Evaluation  Patient identified by MRN, date of birth, ID band Patient awake    Reviewed: Allergy & Precautions, NPO status , Patient's Chart, lab work & pertinent test results  History of Anesthesia Complications Negative for: history of anesthetic complications  Airway Mallampati: IV  TM Distance: >3 FB Neck ROM: Full    Dental  (+) Edentulous Upper, Edentulous Lower, Dental Advisory Given   Pulmonary asthma , sleep apnea ,    Pulmonary exam normal        Cardiovascular Normal cardiovascular exam  HLD   Neuro/Psych  Headaches, Anxiety Depression Bipolar Disorder    GI/Hepatic Neg liver ROS, GERD  ,  Endo/Other  Hypothyroidism Morbid obesity  Renal/GU negative Renal ROS  negative genitourinary   Musculoskeletal  (+) Fibromyalgia -  Abdominal   Peds  Hematology negative hematology ROS (+)   Anesthesia Other Findings   Reproductive/Obstetrics                             Anesthesia Physical Anesthesia Plan  ASA: 3  Anesthesia Plan: General   Post-op Pain Management: Toradol IV (intra-op) and Tylenol PO (pre-op)   Induction: Intravenous  PONV Risk Score and Plan: 3 and Ondansetron, Dexamethasone, Treatment may vary due to age or medical condition and Midazolam  Airway Management Planned: Oral ETT  Additional Equipment: None  Intra-op Plan:   Post-operative Plan: Extubation in OR  Informed Consent: I have reviewed the patients History and Physical, chart, labs and discussed the procedure including the risks, benefits and alternatives for the proposed anesthesia with the patient or authorized representative who has indicated his/her understanding and acceptance.     Dental advisory given  Plan Discussed with:   Anesthesia Plan Comments:         Anesthesia Quick Evaluation

## 2021-05-05 NOTE — Transfer of Care (Signed)
Immediate Anesthesia Transfer of Care Note  Patient: Nancy [name]  Procedure(s) Performed: LAPAROSCOPIC GASTRIC BANDING (Abdomen)  Patient Location: PACU  Anesthesia Type:General  Level of Consciousness: awake and alert   Airway & Oxygen Therapy: Patient Spontanous Breathing and Patient connected to face mask oxygen  Post-op Assessment: Report given to RN and Post -op Vital signs reviewed and stable  Post vital signs: Reviewed and stable  Last Vitals:  Vitals Value Taken Time  BP 129/78 05/05/21 1208  Temp    Pulse 88 05/05/21 1210  Resp 17 05/05/21 1210  SpO2 100 % 05/05/21 1210  Vitals shown include unvalidated device data.  Last Pain: There were no vitals filed for this visit.       Complications: No notable events documented.

## 2021-05-06 ENCOUNTER — Encounter (HOSPITAL_COMMUNITY): Payer: Self-pay | Admitting: Surgery

## 2021-05-08 ENCOUNTER — Telehealth (HOSPITAL_COMMUNITY): Payer: Self-pay | Admitting: *Deleted

## 2021-05-08 NOTE — Telephone Encounter (Signed)
1.  Tell me about your pain and pain management? Pt states that she has been experiencing some intermittent abdominal/chest that feels like a "pounding sensation more often when first standing up" it comes and goes but a few times a day at least and hurts pretty good in that moment, but then goes away. Pt can tolerate protein shakes and water.  Pt instructed to call [facility] if pain worsens.  2.  Let's talk about fluid intake.  How much total fluid are you taking in? Pt states that she is getting in at least 64oz of fluid including protein shakes, protein water and bottled water. Pt instructed to assess status and suggestions daily utilizing Hydration Action Plan on discharge folder and to call [facility] if in the "red zone".   3.  How much protein have you taken in the last 2 days? Pt states that she is working to meet goal of goal of 60g of protein today.  Pt has been able to consume 50g of protein daily  Pt plans to drink remainder of protein throughout the rest of the day to meet goal.  4.  Have you had nausea?  Tell me about when have experienced nausea and what you did to help? Pt denies nausea.   5.  Has the frequency or color changed with your urine? Pt states that she is urinating "fine" with no changes in frequency or urgency.     6.  Tell me what your incisions look like? "Incisions look fine". Pt denies a fever, chills.  Pt states incisions are not swollen, open, or draining.  Pt encouraged to call [facility] if incisions change.   7.  Have you been passing gas? BM? Pt states that she has been having liquids stools today.  Denies any other dehydration symptom.  Pt instructed to call [facility] for further instructions.    8.  If a problem or question were to arise who would you call?  Do you know contact numbers for [place], [facility], and [facility]? Pt denies any other dehydration symptoms.  Pt can describe s/sx of dehydration.  Pt knows to call [facility] for surgical, [facility] for nutrition, and [facility] for non-urgent questions or  concerns.   9.  How has the walking going? Pt states she is walking around and able to be active without difficulty.   10. Are you still using your incentive spirometer?  If so, how often? Pt states that she is doing her I.S. Pt encouraged to use incentive spirometer, at least 10x every hour while awake until she sees the surgeon.  11.  How are your vitamins and calcium going?  How are you taking them? Pt states that she is taking her supplements and vitamins without difficulty.  Reminded patient that the first 30 days post-operatively are important for successful recovery.  Practice good hand hygiene, wearing a mask when appropriate (since optional in most places), and minimizing exposure to people who live outside of the home, especially if they are exhibiting any respiratory, GI, or illness-like symptoms.

## 2021-05-16 DIAGNOSIS — Z79899 Other long term (current) drug therapy: Secondary | ICD-10-CM | POA: Diagnosis not present

## 2021-05-16 DIAGNOSIS — R030 Elevated blood-pressure reading, without diagnosis of hypertension: Secondary | ICD-10-CM | POA: Diagnosis not present

## 2021-05-16 DIAGNOSIS — F1120 Opioid dependence, uncomplicated: Secondary | ICD-10-CM | POA: Diagnosis not present

## 2021-05-16 DIAGNOSIS — G894 Chronic pain syndrome: Secondary | ICD-10-CM | POA: Diagnosis not present

## 2021-05-16 DIAGNOSIS — M545 Low back pain, unspecified: Secondary | ICD-10-CM | POA: Diagnosis not present

## 2021-05-20 ENCOUNTER — Other Ambulatory Visit: Payer: Self-pay

## 2021-05-20 ENCOUNTER — Encounter: Payer: Medicare HMO | Attending: Surgery | Admitting: Skilled Nursing Facility1

## 2021-05-20 DIAGNOSIS — E669 Obesity, unspecified: Secondary | ICD-10-CM | POA: Diagnosis not present

## 2021-05-21 NOTE — Progress Notes (Signed)
2 Week Post-Operative Nutrition Class   Patient was seen on 05/20/2021 for Post-Operative Nutrition education at the [facility]    Surgery date: 05/05/2021 Surgery type: Laparoscopic Band  Start weight at [facility]: 276 lbs Weight today: 249.5 Bowel Habits: Every day to every other day no complaints   Body Composition Scale 05/20/2021  Current Body Weight 249.5  Total Body Fat % 44.8  Visceral Fat 15  Fat-Free Mass % 55.1   Total Body Water % 42  Muscle-Mass lbs 32.2  BMI 42.7  Body Fat Displacement          Torso  lbs 69.3         Left Leg  lbs 13.8         Right Leg  lbs 13.8         Left Arm  lbs 6.9         Right Arm   lbs 6.9   Pt states she had diarrhea the first 9 day post surgery but is now normal   The following the learning objectives were met by the patient during this course: Identifies Phase 3 (Soft, High Proteins) Dietary Goals and will begin from 2 weeks post-operatively to 2 months post-operatively Identifies appropriate sources of fluids and proteins  Identifies appropriate fat sources and healthy verses unhealthy fat types   States protein recommendations and appropriate sources post-operatively Identifies the need for appropriate texture modifications, mastication, and bite sizes when consuming solids Identifies appropriate fat consumption and sources Identifies appropriate multivitamin and calcium sources post-operatively Describes the need for physical activity post-operatively and will follow MD recommendations States when to call healthcare provider regarding medication questions or post-operative complications   Handouts given during class include: Phase 3A: Soft, High Protein Diet Handout Phase 3 High Protein Meals Healthy Fats   Follow-Up Plan: Patient will follow-up at [facility] in 6 weeks for 2 month post-op nutrition visit for diet advancement per MD.

## 2021-05-22 DIAGNOSIS — F4320 Adjustment disorder, unspecified: Secondary | ICD-10-CM | POA: Diagnosis not present

## 2021-05-22 DIAGNOSIS — F3162 Bipolar disorder, current episode mixed, moderate: Secondary | ICD-10-CM | POA: Diagnosis not present

## 2021-06-17 DIAGNOSIS — Z79899 Other long term (current) drug therapy: Secondary | ICD-10-CM | POA: Diagnosis not present

## 2021-06-17 DIAGNOSIS — G894 Chronic pain syndrome: Secondary | ICD-10-CM | POA: Diagnosis not present

## 2021-06-17 DIAGNOSIS — M545 Low back pain, unspecified: Secondary | ICD-10-CM | POA: Diagnosis not present

## 2021-06-17 DIAGNOSIS — R030 Elevated blood-pressure reading, without diagnosis of hypertension: Secondary | ICD-10-CM | POA: Diagnosis not present

## 2021-06-17 DIAGNOSIS — F1120 Opioid dependence, uncomplicated: Secondary | ICD-10-CM | POA: Diagnosis not present

## 2021-06-26 DIAGNOSIS — F4320 Adjustment disorder, unspecified: Secondary | ICD-10-CM | POA: Diagnosis not present

## 2021-06-26 DIAGNOSIS — F3162 Bipolar disorder, current episode mixed, moderate: Secondary | ICD-10-CM | POA: Diagnosis not present

## 2021-07-01 ENCOUNTER — Encounter: Payer: Medicare HMO | Attending: Surgery | Admitting: Skilled Nursing Facility1

## 2021-07-01 ENCOUNTER — Other Ambulatory Visit: Payer: Self-pay

## 2021-07-01 DIAGNOSIS — Z6841 Body Mass Index (BMI) 40.0 and over, adult: Secondary | ICD-10-CM | POA: Diagnosis not present

## 2021-07-01 DIAGNOSIS — E282 Polycystic ovarian syndrome: Secondary | ICD-10-CM | POA: Insufficient documentation

## 2021-07-01 DIAGNOSIS — R7303 Prediabetes: Secondary | ICD-10-CM | POA: Insufficient documentation

## 2021-07-01 DIAGNOSIS — Z9884 Bariatric surgery status: Secondary | ICD-10-CM | POA: Diagnosis not present

## 2021-07-01 DIAGNOSIS — Z713 Dietary counseling and surveillance: Secondary | ICD-10-CM | POA: Diagnosis not present

## 2021-07-01 DIAGNOSIS — E669 Obesity, unspecified: Secondary | ICD-10-CM | POA: Insufficient documentation

## 2021-07-01 NOTE — Progress Notes (Signed)
Bariatric Nutrition Follow-Up Visit Medical Nutrition Therapy    NUTRITION ASSESSMENT  Surgery date: 05/05/2021 Surgery type: Laparoscopic Band  Start weight at [facility]: 276 lbs Weight today: 249.5   Body Composition Scale 05/20/2021 07/01/2021  Current Body Weight 249.5 243.8  Total Body Fat % 44.8 44.3  Visceral Fat 15 14  Fat-Free Mass % 55.1 55.6   Total Body Water % 42 42.3  Muscle-Mass lbs 32.2 32.2  BMI 42.7 41.8  Body Fat Displacement           Torso  lbs 69.3 67         Left Leg  lbs 13.8 13.4         Right Leg  lbs 13.8 13.4         Left Arm  lbs 6.9 6.7         Right Arm   lbs 6.9 6.7   Clinical  Medical hx: bipolar, depression, anxiety, PCOS, fibomyalgia, prediabetes Medications: see list Labs: A1C 6.5 Notable signs/symptoms: N/A Any previous deficiencies? Vitamin D, vitmain B12   Lifestyle & Dietary Hx  Pt states she currently has 6 CCs in her band and will get another fill about 2 weeks from today.   Pt states she thinks there is a a little bit more fluid to be put in. Pt states she has vomited twice from not chewing well enough and has learned her lesson.    Pt states she snacks on cheese in between meals. Pt states she is exhausted all the time stating she thinks that is fibromyalgia.   Pt states she is aware her A1C went up and is getting it rechecked next month.   Pt states she does currently have restriction.   Pt states she has been taking senekot and colace for months.   Estimated daily fluid intake: 64+ oz Estimated daily protein intake: 60 g Supplements: celebrate  Current average weekly physical activity: rebounding 5-12 minutes 7 days a week   24-Hr Dietary Recall First Meal: 2 eggs Snack: cheese  Second Meal: chili Snack: cheese  Third Meal: chili Snack:  Beverages: powerade zero, occasional protein shake, water  Post-Op Goals/ Signs/ Symptoms Using straws: no Drinking while eating: no Chewing/swallowing difficulties: no Changes  in vision: no Changes to mood/headaches: no Hair loss/changes to skin/nails: no Difficulty focusing/concentrating: no Sweating: no Limb weakness: no Dizziness/lightheadedness: no Palpitations: no  Carbonated/caffeinated beverages: no N/V/D/C/Gas: taking senekote and colace Abdominal pain: no Dumping syndrome: no    NUTRITION DIAGNOSIS  Overweight/obesity ([place]-3.3) related to past poor dietary habits and physical inactivity as evidenced by completed bariatric surgery and following dietary guidelines for continued weight loss and healthy nutrition status.     NUTRITION INTERVENTION Nutrition counseling (C-1) and education (E-2) to facilitate bariatric surgery goals, including: Diet advancement to the next phase (phase 4) now including none starchy vegetables The importance of consuming adequate calories as well as certain nutrients daily due to the body's need for essential vitamins, minerals, and fats The importance of daily physical activity and to reach a goal of at least 150 minutes of moderate to vigorous physical activity weekly (or as directed by their physician) due to benefits such as increased musculature and improved lab values The importance of intuitive eating specifically learning hunger-satiety cues and understanding the importance of learning a new body: The importance of mindful eating to avoid grazing behaviors   Goals: -Continue to aim for a minimum of 64 fluid ounces 7 days a week with at least  30 ounces being plain water  -Eat non-starchy vegetables 2 times a day 7 days a week  -Start out with soft cooked vegetables today and tomorrow; if tolerated begin to eat raw vegetables or cooked including salads  -Eat your 3 ounces of protein first then start in on your non-starchy vegetables; once you understand how much of your meal leads to satisfaction and not full while still eating 3 ounces of protein and non-starchy vegetables you can eat them in any order   -Continue  to aim for 30 minutes of activity at least 5 times a week  -Do NOT cook with/add to your food: alfredo sauce, cheese sauce, barbeque sauce, ketchup, fat back, butter, bacon grease, grease, Crisco, OR SUGAR  -use cheese as a garnish not a flavoring   -try yoga    Handouts Provided Include  Phase 4  Learning Style & Readiness for Change Teaching method utilized: Visual & Auditory  Demonstrated degree of understanding via: Teach Back  Readiness Level: action Barriers to learning/adherence to lifestyle change: none stated  RD's Notes for Next Visit Assess adherence to pt chosen goals    MONITORING & EVALUATION Dietary intake, weekly physical activity, body weight  Next Steps Patient is to follow-up in Early May

## 2021-07-17 DIAGNOSIS — G894 Chronic pain syndrome: Secondary | ICD-10-CM | POA: Diagnosis not present

## 2021-07-17 DIAGNOSIS — Z79899 Other long term (current) drug therapy: Secondary | ICD-10-CM | POA: Diagnosis not present

## 2021-07-17 DIAGNOSIS — M545 Low back pain, unspecified: Secondary | ICD-10-CM | POA: Diagnosis not present

## 2021-07-17 DIAGNOSIS — F1120 Opioid dependence, uncomplicated: Secondary | ICD-10-CM | POA: Diagnosis not present

## 2021-07-17 DIAGNOSIS — R030 Elevated blood-pressure reading, without diagnosis of hypertension: Secondary | ICD-10-CM | POA: Diagnosis not present

## 2021-07-17 DIAGNOSIS — E559 Vitamin D deficiency, unspecified: Secondary | ICD-10-CM | POA: Diagnosis not present

## 2021-08-14 DIAGNOSIS — M545 Low back pain, unspecified: Secondary | ICD-10-CM | POA: Diagnosis not present

## 2021-08-14 DIAGNOSIS — F1120 Opioid dependence, uncomplicated: Secondary | ICD-10-CM | POA: Diagnosis not present

## 2021-08-14 DIAGNOSIS — R030 Elevated blood-pressure reading, without diagnosis of hypertension: Secondary | ICD-10-CM | POA: Diagnosis not present

## 2021-08-14 DIAGNOSIS — Z79899 Other long term (current) drug therapy: Secondary | ICD-10-CM | POA: Diagnosis not present

## 2021-08-14 DIAGNOSIS — G894 Chronic pain syndrome: Secondary | ICD-10-CM | POA: Diagnosis not present

## 2021-08-28 DIAGNOSIS — L905 Scar conditions and fibrosis of skin: Secondary | ICD-10-CM | POA: Diagnosis not present

## 2021-08-28 DIAGNOSIS — D2371 Other benign neoplasm of skin of right lower limb, including hip: Secondary | ICD-10-CM | POA: Diagnosis not present

## 2021-08-28 DIAGNOSIS — L7211 Pilar cyst: Secondary | ICD-10-CM | POA: Diagnosis not present

## 2021-08-28 DIAGNOSIS — D2362 Other benign neoplasm of skin of left upper limb, including shoulder: Secondary | ICD-10-CM | POA: Diagnosis not present

## 2021-08-28 DIAGNOSIS — D225 Melanocytic nevi of trunk: Secondary | ICD-10-CM | POA: Diagnosis not present

## 2021-09-01 DIAGNOSIS — R1310 Dysphagia, unspecified: Secondary | ICD-10-CM | POA: Diagnosis not present

## 2021-09-01 DIAGNOSIS — Z4651 Encounter for fitting and adjustment of gastric lap band: Secondary | ICD-10-CM | POA: Diagnosis not present

## 2021-09-01 DIAGNOSIS — Z9884 Bariatric surgery status: Secondary | ICD-10-CM | POA: Diagnosis not present

## 2021-09-09 DIAGNOSIS — F4321 Adjustment disorder with depressed mood: Secondary | ICD-10-CM | POA: Diagnosis not present

## 2021-09-09 DIAGNOSIS — F3162 Bipolar disorder, current episode mixed, moderate: Secondary | ICD-10-CM | POA: Diagnosis not present

## 2021-09-15 DIAGNOSIS — R030 Elevated blood-pressure reading, without diagnosis of hypertension: Secondary | ICD-10-CM | POA: Diagnosis not present

## 2021-09-15 DIAGNOSIS — Z79899 Other long term (current) drug therapy: Secondary | ICD-10-CM | POA: Diagnosis not present

## 2021-09-15 DIAGNOSIS — Z0000 Encounter for general adult medical examination without abnormal findings: Secondary | ICD-10-CM | POA: Diagnosis not present

## 2021-09-15 DIAGNOSIS — M545 Low back pain, unspecified: Secondary | ICD-10-CM | POA: Diagnosis not present

## 2021-09-15 DIAGNOSIS — G894 Chronic pain syndrome: Secondary | ICD-10-CM | POA: Diagnosis not present

## 2021-09-15 DIAGNOSIS — F1120 Opioid dependence, uncomplicated: Secondary | ICD-10-CM | POA: Diagnosis not present

## 2021-09-19 DIAGNOSIS — M797 Fibromyalgia: Secondary | ICD-10-CM | POA: Diagnosis not present

## 2021-09-19 DIAGNOSIS — G43009 Migraine without aura, not intractable, without status migrainosus: Secondary | ICD-10-CM | POA: Diagnosis not present

## 2021-09-19 DIAGNOSIS — N3281 Overactive bladder: Secondary | ICD-10-CM | POA: Diagnosis not present

## 2021-09-19 DIAGNOSIS — R7303 Prediabetes: Secondary | ICD-10-CM | POA: Diagnosis not present

## 2021-09-19 DIAGNOSIS — Z0000 Encounter for general adult medical examination without abnormal findings: Secondary | ICD-10-CM | POA: Diagnosis not present

## 2021-09-19 DIAGNOSIS — E039 Hypothyroidism, unspecified: Secondary | ICD-10-CM | POA: Diagnosis not present

## 2021-09-19 DIAGNOSIS — F319 Bipolar disorder, unspecified: Secondary | ICD-10-CM | POA: Diagnosis not present

## 2021-09-19 DIAGNOSIS — G4733 Obstructive sleep apnea (adult) (pediatric): Secondary | ICD-10-CM | POA: Diagnosis not present

## 2021-09-19 DIAGNOSIS — E7800 Pure hypercholesterolemia, unspecified: Secondary | ICD-10-CM | POA: Diagnosis not present

## 2021-09-23 ENCOUNTER — Encounter: Payer: Medicare HMO | Attending: Surgery | Admitting: Skilled Nursing Facility1

## 2021-09-23 ENCOUNTER — Encounter: Payer: Self-pay | Admitting: Skilled Nursing Facility1

## 2021-09-23 DIAGNOSIS — F4320 Adjustment disorder, unspecified: Secondary | ICD-10-CM | POA: Diagnosis not present

## 2021-09-23 DIAGNOSIS — E669 Obesity, unspecified: Secondary | ICD-10-CM | POA: Insufficient documentation

## 2021-09-23 DIAGNOSIS — F3162 Bipolar disorder, current episode mixed, moderate: Secondary | ICD-10-CM | POA: Diagnosis not present

## 2021-09-23 DIAGNOSIS — Z713 Dietary counseling and surveillance: Secondary | ICD-10-CM | POA: Insufficient documentation

## 2021-09-23 DIAGNOSIS — Z6841 Body Mass Index (BMI) 40.0 and over, adult: Secondary | ICD-10-CM | POA: Diagnosis not present
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
## 2021-10-09 DIAGNOSIS — Z4651 Encounter for fitting and adjustment of gastric lap band: Secondary | ICD-10-CM | POA: Diagnosis not present

## 2021-10-09 DIAGNOSIS — Z9884 Bariatric surgery status: Secondary | ICD-10-CM | POA: Diagnosis not present

## 2021-10-13 DIAGNOSIS — Z79899 Other long term (current) drug therapy: Secondary | ICD-10-CM | POA: Diagnosis not present

## 2021-10-13 DIAGNOSIS — M545 Low back pain, unspecified: Secondary | ICD-10-CM | POA: Diagnosis not present

## 2021-10-13 DIAGNOSIS — F1120 Opioid dependence, uncomplicated: Secondary | ICD-10-CM | POA: Diagnosis not present

## 2021-10-13 DIAGNOSIS — G894 Chronic pain syndrome: Secondary | ICD-10-CM | POA: Diagnosis not present

## 2021-10-13 DIAGNOSIS — R030 Elevated blood-pressure reading, without diagnosis of hypertension: Secondary | ICD-10-CM | POA: Diagnosis not present

## 2021-10-21 DIAGNOSIS — F4321 Adjustment disorder with depressed mood: Secondary | ICD-10-CM | POA: Diagnosis not present

## 2021-10-21 DIAGNOSIS — F3162 Bipolar disorder, current episode mixed, moderate: Secondary | ICD-10-CM | POA: Diagnosis not present

## 2021-10-24 DIAGNOSIS — Z1231 Encounter for screening mammogram for malignant neoplasm of breast: Secondary | ICD-10-CM | POA: Diagnosis not present

## 2021-10-24 DIAGNOSIS — Z01419 Encounter for gynecological examination (general) (routine) without abnormal findings: Secondary | ICD-10-CM | POA: Diagnosis not present

## 2021-10-24 DIAGNOSIS — N760 Acute vaginitis: Secondary | ICD-10-CM | POA: Diagnosis not present

## 2021-10-24 DIAGNOSIS — Z0142 Encounter for cervical smear to confirm findings of recent normal smear following initial abnormal smear: Secondary | ICD-10-CM | POA: Diagnosis not present

## 2021-10-24 DIAGNOSIS — Z6837 Body mass index (BMI) 37.0-37.9, adult: Secondary | ICD-10-CM | POA: Diagnosis not present

## 2021-10-24 DIAGNOSIS — N898 Other specified noninflammatory disorders of vagina: Secondary | ICD-10-CM | POA: Diagnosis not present

## 2021-10-24 DIAGNOSIS — Z113 Encounter for screening for infections with a predominantly sexual mode of transmission: Secondary | ICD-10-CM | POA: Diagnosis not present

## 2021-10-24 DIAGNOSIS — Z124 Encounter for screening for malignant neoplasm of cervix: Secondary | ICD-10-CM | POA: Diagnosis not present

## 2021-10-24 DIAGNOSIS — Z01411 Encounter for gynecological examination (general) (routine) with abnormal findings: Secondary | ICD-10-CM | POA: Diagnosis not present

## 2021-11-04 DIAGNOSIS — F4320 Adjustment disorder, unspecified: Secondary | ICD-10-CM | POA: Diagnosis not present

## 2021-11-04 DIAGNOSIS — F3162 Bipolar disorder, current episode mixed, moderate: Secondary | ICD-10-CM | POA: Diagnosis not present

## 2021-11-05 DIAGNOSIS — F1120 Opioid dependence, uncomplicated: Secondary | ICD-10-CM | POA: Diagnosis not present

## 2021-11-05 DIAGNOSIS — G894 Chronic pain syndrome: Secondary | ICD-10-CM | POA: Diagnosis not present

## 2021-11-05 DIAGNOSIS — R030 Elevated blood-pressure reading, without diagnosis of hypertension: Secondary | ICD-10-CM | POA: Diagnosis not present

## 2021-11-05 DIAGNOSIS — M545 Low back pain, unspecified: Secondary | ICD-10-CM | POA: Diagnosis not present

## 2021-11-05 DIAGNOSIS — Z79899 Other long term (current) drug therapy: Secondary | ICD-10-CM | POA: Diagnosis not present

## 2021-11-05 DIAGNOSIS — Z6838 Body mass index (BMI) 38.0-38.9, adult: Secondary | ICD-10-CM | POA: Diagnosis not present

## 2021-11-27 DIAGNOSIS — F4320 Adjustment disorder, unspecified: Secondary | ICD-10-CM | POA: Diagnosis not present

## 2021-11-27 DIAGNOSIS — F3162 Bipolar disorder, current episode mixed, moderate: Secondary | ICD-10-CM | POA: Diagnosis not present

## 2021-12-03 DIAGNOSIS — Z9884 Bariatric surgery status: Secondary | ICD-10-CM | POA: Diagnosis not present

## 2021-12-11 DIAGNOSIS — Z79899 Other long term (current) drug therapy: Secondary | ICD-10-CM | POA: Diagnosis not present

## 2021-12-11 DIAGNOSIS — Z9181 History of falling: Secondary | ICD-10-CM | POA: Diagnosis not present

## 2021-12-11 DIAGNOSIS — R030 Elevated blood-pressure reading, without diagnosis of hypertension: Secondary | ICD-10-CM | POA: Diagnosis not present

## 2021-12-11 DIAGNOSIS — M545 Low back pain, unspecified: Secondary | ICD-10-CM | POA: Diagnosis not present

## 2021-12-11 DIAGNOSIS — Z6837 Body mass index (BMI) 37.0-37.9, adult: Secondary | ICD-10-CM | POA: Diagnosis not present

## 2021-12-11 DIAGNOSIS — F1120 Opioid dependence, uncomplicated: Secondary | ICD-10-CM | POA: Diagnosis not present

## 2021-12-11 DIAGNOSIS — G894 Chronic pain syndrome: Secondary | ICD-10-CM | POA: Diagnosis not present

## 2021-12-23 ENCOUNTER — Encounter: Payer: Self-pay | Admitting: Skilled Nursing Facility1

## 2021-12-23 ENCOUNTER — Encounter: Payer: Medicare HMO | Attending: Surgery | Admitting: Skilled Nursing Facility1

## 2021-12-23 DIAGNOSIS — R7303 Prediabetes: Secondary | ICD-10-CM | POA: Insufficient documentation

## 2021-12-23 DIAGNOSIS — Z6841 Body Mass Index (BMI) 40.0 and over, adult: Secondary | ICD-10-CM | POA: Diagnosis not present

## 2021-12-23 DIAGNOSIS — Z713 Dietary counseling and surveillance: Secondary | ICD-10-CM | POA: Diagnosis not present

## 2021-12-23 DIAGNOSIS — Z9884 Bariatric surgery status: Secondary | ICD-10-CM | POA: Diagnosis not present

## 2021-12-23 DIAGNOSIS — E669 Obesity, unspecified: Secondary | ICD-10-CM | POA: Insufficient documentation

## 2021-12-23 NOTE — Progress Notes (Signed)
Bariatric Nutrition Follow-Up Visit Medical Nutrition Therapy    NUTRITION ASSESSMENT  Surgery date: 05/05/2021 Surgery type: Laparoscopic Band  Start weight at [facility]: 276 lbs Weight today: 220 pounds   Body Composition Scale 05/20/2021 07/01/2021 09/23/2021 12/23/2021  Current Body Weight 249.5 243.8 236.3 220  Total Body Fat % 44.8 44.3 43.5 41.7  Visceral Fat '15 14 13 12  '$ Fat-Free Mass % 55.1 55.6 56.4 58.2   Total Body Water % 42 42.3 42.7 43.6  Muscle-Mass lbs 32.2 32.2 32.1 31.9  BMI 42.7 41.8 40.5 37.6  Body Fat Displacement             Torso  lbs 69.3 67 63.7 56.8         Left Leg  lbs 13.8 13.4 12.7 11.3         Right Leg  lbs 13.8 13.4 12.7 11.3         Left Arm  lbs 6.9 6.7 6.3 5.6         Right Arm   lbs 6.9 6.7 6.3 5.6   Clinical  Medical hx: bipolar, depression, anxiety, PCOS, fibomyalgia, prediabetes Medications: see list: Stopped doxapin and added in thorapin Labs: A1C 6.5 Notable signs/symptoms: N/A Any previous deficiencies? Vitamin D, vitmain B12   Lifestyle & Dietary Hx  Pt states she has been having indigestion about 2 nights a week: Dietitian recommended she stop eating 3 hours before bed.  Pt states she is no longer using her cane as much and does not have as many fibro flares.    Estimated daily fluid intake: 64+ oz Estimated daily protein intake: 60 g Supplements: celebrate multi and calcium  Current average weekly physical activity: walking 2 times a day rebounding 10-15 minutes 3-4 times a week, 7 days a week for 10-15 minutes Pt states she would be more active, but has fibromyalgia. Pt states her 2X clothes are fitting loosely, and is excited about that  24-Hr Dietary Recall First Meal: [place] and cheese Snack: carrots and ranch Second Meal: chicken + sugar free BBQ sauce + baked potato and green beans or broccoli Snack: celery and peanut butter Third Meal: chicken + sugar free BBQ sauce + baked potato and green beans or broccoli Snack:  no Beverages: powerade zero, water, 1% milk  Post-Op Goals/ Signs/ Symptoms Using straws: no Drinking while eating: no Chewing/swallowing difficulties: no Changes in vision: no Changes to mood/headaches: no Hair loss/changes to skin/nails: no Difficulty focusing/concentrating: no Sweating: no Limb weakness: no Dizziness/lightheadedness: no Palpitations: no  Carbonated/caffeinated beverages: no N/V/D/C/Gas: taking senekote and colace Abdominal pain: no Dumping syndrome: no    NUTRITION DIAGNOSIS  Overweight/obesity ([place]-3.3) related to past poor dietary habits and physical inactivity as evidenced by completed bariatric surgery and following dietary guidelines for continued weight loss and healthy nutrition status.     NUTRITION INTERVENTION Nutrition counseling (C-1) and education (E-2) to facilitate bariatric surgery goals, including: The importance of consuming adequate calories as well as certain nutrients daily due to the body's need for essential vitamins, minerals, and fats The importance of daily physical activity and to reach a goal of at least 150 minutes of moderate to vigorous physical activity weekly (or as directed by their physician) due to benefits such as increased musculature and improved lab values The importance of intuitive eating specifically learning hunger-satiety cues and understanding the importance of learning a new body: The importance of mindful eating to avoid grazing behaviors  Try spaghetti squash Encouraged patient to honor their body's  internal hunger and fullness cues.  Throughout the day, check in mentally and rate hunger. Stop eating when satisfied not full regardless of how much food is left on the plate.  Get more if still hungry 20-30 minutes later.  The key is to honor satisfaction so throughout the meal, rate fullness factor and stop when comfortably satisfied not physically full. The key is to honor hunger and fullness without any feelings of  guilt or shame.  Pay attention to what the internal cues are, rather than any external factors. This will enhance the confidence you have in listening to your own body and following those internal cues enabling you to increase how often you eat when you are hungry not out of appetite and stop when you are satisfied not full.  Encouraged pt to continue to eat balanced meals inclusive of non starchy vegetables 2 times a day 7 days a week Encouraged pt to choose lean protein sources: limiting beef, pork, sausage, hotdogs, and lunch meat Encourage pt to choose healthy fats such as plant based limiting animal fats Encouraged pt to continue to drink a minium 64 fluid ounces with half being plain water to satisfy proper hydration   Handouts Provided Include  Phase 5  Learning Style & Readiness for Change Teaching method utilized: Visual & Auditory  Demonstrated degree of understanding via: Teach Back  Readiness Level: action Barriers to learning/adherence to lifestyle change: none stated  RD's Notes for Next Visit Assess adherence to pt chosen goals    MONITORING & EVALUATION Dietary intake, weekly physical activity, body weight  Next Steps Patient is to follow-up in 3 months

## 2021-12-25 DIAGNOSIS — F430 Acute stress reaction: Secondary | ICD-10-CM | POA: Diagnosis not present

## 2021-12-25 DIAGNOSIS — F3162 Bipolar disorder, current episode mixed, moderate: Secondary | ICD-10-CM | POA: Diagnosis not present

## 2021-12-31 ENCOUNTER — Encounter (INDEPENDENT_AMBULATORY_CARE_PROVIDER_SITE_OTHER): Payer: Self-pay

## 2022-01-09 DIAGNOSIS — R030 Elevated blood-pressure reading, without diagnosis of hypertension: Secondary | ICD-10-CM | POA: Diagnosis not present

## 2022-01-09 DIAGNOSIS — Z6837 Body mass index (BMI) 37.0-37.9, adult: Secondary | ICD-10-CM | POA: Diagnosis not present

## 2022-01-09 DIAGNOSIS — F1120 Opioid dependence, uncomplicated: Secondary | ICD-10-CM | POA: Diagnosis not present

## 2022-01-09 DIAGNOSIS — Z79899 Other long term (current) drug therapy: Secondary | ICD-10-CM | POA: Diagnosis not present

## 2022-01-09 DIAGNOSIS — G894 Chronic pain syndrome: Secondary | ICD-10-CM | POA: Diagnosis not present

## 2022-01-09 DIAGNOSIS — M545 Low back pain, unspecified: Secondary | ICD-10-CM | POA: Diagnosis not present

## 2022-01-09 DIAGNOSIS — Z9181 History of falling: Secondary | ICD-10-CM | POA: Diagnosis not present

## 2022-02-04 DIAGNOSIS — Z9884 Bariatric surgery status: Secondary | ICD-10-CM | POA: Diagnosis not present

## 2022-02-04 DIAGNOSIS — Z4651 Encounter for fitting and adjustment of gastric lap band: Secondary | ICD-10-CM | POA: Diagnosis not present

## 2022-02-12 DIAGNOSIS — Z9181 History of falling: Secondary | ICD-10-CM | POA: Diagnosis not present

## 2022-02-12 DIAGNOSIS — F1120 Opioid dependence, uncomplicated: Secondary | ICD-10-CM | POA: Diagnosis not present

## 2022-02-12 DIAGNOSIS — M545 Low back pain, unspecified: Secondary | ICD-10-CM | POA: Diagnosis not present

## 2022-02-12 DIAGNOSIS — Z6838 Body mass index (BMI) 38.0-38.9, adult: Secondary | ICD-10-CM | POA: Diagnosis not present

## 2022-02-12 DIAGNOSIS — G894 Chronic pain syndrome: Secondary | ICD-10-CM | POA: Diagnosis not present

## 2022-02-12 DIAGNOSIS — R030 Elevated blood-pressure reading, without diagnosis of hypertension: Secondary | ICD-10-CM | POA: Diagnosis not present

## 2022-02-12 DIAGNOSIS — Z79899 Other long term (current) drug therapy: Secondary | ICD-10-CM | POA: Diagnosis not present

## 2022-02-19 DIAGNOSIS — F3162 Bipolar disorder, current episode mixed, moderate: Secondary | ICD-10-CM | POA: Diagnosis not present

## 2022-02-19 DIAGNOSIS — F4320 Adjustment disorder, unspecified: Secondary | ICD-10-CM | POA: Diagnosis not present

## 2022-03-12 DIAGNOSIS — M545 Low back pain, unspecified: Secondary | ICD-10-CM | POA: Diagnosis not present

## 2022-03-12 DIAGNOSIS — Z6838 Body mass index (BMI) 38.0-38.9, adult: Secondary | ICD-10-CM | POA: Diagnosis not present

## 2022-03-12 DIAGNOSIS — F1120 Opioid dependence, uncomplicated: Secondary | ICD-10-CM | POA: Diagnosis not present

## 2022-03-12 DIAGNOSIS — Z79899 Other long term (current) drug therapy: Secondary | ICD-10-CM | POA: Diagnosis not present

## 2022-03-12 DIAGNOSIS — G894 Chronic pain syndrome: Secondary | ICD-10-CM | POA: Diagnosis not present

## 2022-03-12 DIAGNOSIS — R030 Elevated blood-pressure reading, without diagnosis of hypertension: Secondary | ICD-10-CM | POA: Diagnosis not present

## 2022-03-12 DIAGNOSIS — Z6841 Body Mass Index (BMI) 40.0 and over, adult: Secondary | ICD-10-CM | POA: Diagnosis not present

## 2022-03-12 DIAGNOSIS — Z9181 History of falling: Secondary | ICD-10-CM | POA: Diagnosis not present

## 2022-03-24 DIAGNOSIS — R7303 Prediabetes: Secondary | ICD-10-CM | POA: Diagnosis not present

## 2022-03-24 DIAGNOSIS — R1310 Dysphagia, unspecified: Secondary | ICD-10-CM | POA: Diagnosis not present

## 2022-03-24 DIAGNOSIS — Z9884 Bariatric surgery status: Secondary | ICD-10-CM | POA: Diagnosis not present

## 2022-04-09 DIAGNOSIS — M545 Low back pain, unspecified: Secondary | ICD-10-CM | POA: Diagnosis not present

## 2022-04-09 DIAGNOSIS — F1120 Opioid dependence, uncomplicated: Secondary | ICD-10-CM | POA: Diagnosis not present

## 2022-04-09 DIAGNOSIS — Z6838 Body mass index (BMI) 38.0-38.9, adult: Secondary | ICD-10-CM | POA: Diagnosis not present

## 2022-04-09 DIAGNOSIS — Z79899 Other long term (current) drug therapy: Secondary | ICD-10-CM | POA: Diagnosis not present

## 2022-04-09 DIAGNOSIS — R030 Elevated blood-pressure reading, without diagnosis of hypertension: Secondary | ICD-10-CM | POA: Diagnosis not present

## 2022-04-09 DIAGNOSIS — G894 Chronic pain syndrome: Secondary | ICD-10-CM | POA: Diagnosis not present

## 2022-04-09 DIAGNOSIS — Z9181 History of falling: Secondary | ICD-10-CM | POA: Diagnosis not present

## 2022-04-13 DIAGNOSIS — Z79899 Other long term (current) drug therapy: Secondary | ICD-10-CM | POA: Diagnosis not present

## 2022-04-15 DIAGNOSIS — Z9884 Bariatric surgery status: Secondary | ICD-10-CM | POA: Diagnosis not present

## 2022-04-20 ENCOUNTER — Other Ambulatory Visit (HOSPITAL_COMMUNITY): Payer: Self-pay

## 2022-04-20 MED ORDER — OXYCODONE-ACETAMINOPHEN 10-325 MG PO TABS
1.0000 | ORAL_TABLET | Freq: Three times a day (TID) | ORAL | 0 refills | Status: DC | PRN
Start: 2022-04-20 — End: 2022-05-11
  Filled 2022-04-20: qty 90, 30d supply, fill #0

## 2022-05-11 ENCOUNTER — Other Ambulatory Visit (HOSPITAL_COMMUNITY): Payer: Self-pay

## 2022-05-11 ENCOUNTER — Ambulatory Visit: Payer: Medicare HMO | Admitting: Skilled Nursing Facility1

## 2022-05-11 DIAGNOSIS — F1120 Opioid dependence, uncomplicated: Secondary | ICD-10-CM | POA: Diagnosis not present

## 2022-05-11 DIAGNOSIS — Z79899 Other long term (current) drug therapy: Secondary | ICD-10-CM | POA: Diagnosis not present

## 2022-05-11 DIAGNOSIS — Z9181 History of falling: Secondary | ICD-10-CM | POA: Diagnosis not present

## 2022-05-11 DIAGNOSIS — Z6841 Body Mass Index (BMI) 40.0 and over, adult: Secondary | ICD-10-CM | POA: Diagnosis not present

## 2022-05-11 DIAGNOSIS — G894 Chronic pain syndrome: Secondary | ICD-10-CM | POA: Diagnosis not present

## 2022-05-11 DIAGNOSIS — M545 Low back pain, unspecified: Secondary | ICD-10-CM | POA: Diagnosis not present

## 2022-05-11 DIAGNOSIS — R030 Elevated blood-pressure reading, without diagnosis of hypertension: Secondary | ICD-10-CM | POA: Diagnosis not present

## 2022-05-11 DIAGNOSIS — Z6837 Body mass index (BMI) 37.0-37.9, adult: Secondary | ICD-10-CM | POA: Diagnosis not present

## 2022-05-11 MED ORDER — METHOCARBAMOL 750 MG PO TABS
750.0000 mg | ORAL_TABLET | Freq: Three times a day (TID) | ORAL | 2 refills | Status: AC
  Filled 2022-05-11: qty 84, 28d supply, fill #0
  Filled 2022-06-04: qty 84, 28d supply, fill #1
  Filled 2022-12-14: qty 84, 14d supply, fill #2

## 2022-05-11 MED ORDER — GABAPENTIN 300 MG PO CAPS
300.0000 mg | ORAL_CAPSULE | Freq: Three times a day (TID) | ORAL | 1 refills | Status: DC
  Filled 2022-05-11: qty 90, 30d supply, fill #0
  Filled 2022-06-04: qty 90, 30d supply, fill #1

## 2022-05-11 MED ORDER — OXYCODONE-ACETAMINOPHEN 10-325 MG PO TABS
1.0000 | ORAL_TABLET | Freq: Three times a day (TID) | ORAL | 0 refills | Status: DC | PRN
  Filled 2022-05-11 – 2022-05-19 (×2): qty 90, 30d supply, fill #0

## 2022-05-12 ENCOUNTER — Other Ambulatory Visit (HOSPITAL_COMMUNITY): Payer: Self-pay

## 2022-05-16 DIAGNOSIS — G894 Chronic pain syndrome: Secondary | ICD-10-CM | POA: Diagnosis not present

## 2022-05-19 ENCOUNTER — Other Ambulatory Visit (HOSPITAL_COMMUNITY): Payer: Self-pay

## 2022-05-19 DIAGNOSIS — M542 Cervicalgia: Secondary | ICD-10-CM | POA: Diagnosis not present

## 2022-05-19 DIAGNOSIS — Z9889 Other specified postprocedural states: Secondary | ICD-10-CM | POA: Diagnosis not present

## 2022-05-26 ENCOUNTER — Other Ambulatory Visit: Payer: Self-pay | Admitting: Family Medicine

## 2022-05-26 ENCOUNTER — Ambulatory Visit
Admission: RE | Admit: 2022-05-26 | Discharge: 2022-05-26 | Disposition: A | Discharge status: Home / Self Care | Payer: Medicare HMO | Source: Ambulatory Visit | Attending: Family Medicine | Admitting: Family Medicine

## 2022-05-26 DIAGNOSIS — M4722 Other spondylosis with radiculopathy, cervical region: Secondary | ICD-10-CM | POA: Diagnosis not present

## 2022-05-26 DIAGNOSIS — M542 Cervicalgia: Secondary | ICD-10-CM

## 2022-05-26 DIAGNOSIS — M50123 Cervical disc disorder at C6-C7 level with radiculopathy: Secondary | ICD-10-CM | POA: Diagnosis not present

## 2022-06-04 DIAGNOSIS — M542 Cervicalgia: Secondary | ICD-10-CM | POA: Diagnosis not present

## 2022-06-18 ENCOUNTER — Other Ambulatory Visit (HOSPITAL_COMMUNITY): Payer: Self-pay

## 2022-06-18 DIAGNOSIS — G894 Chronic pain syndrome: Secondary | ICD-10-CM | POA: Diagnosis not present

## 2022-06-18 DIAGNOSIS — R7303 Prediabetes: Secondary | ICD-10-CM | POA: Diagnosis not present

## 2022-06-18 DIAGNOSIS — R030 Elevated blood-pressure reading, without diagnosis of hypertension: Secondary | ICD-10-CM | POA: Diagnosis not present

## 2022-06-18 DIAGNOSIS — F1120 Opioid dependence, uncomplicated: Secondary | ICD-10-CM | POA: Diagnosis not present

## 2022-06-18 DIAGNOSIS — Z9181 History of falling: Secondary | ICD-10-CM | POA: Diagnosis not present

## 2022-06-18 DIAGNOSIS — M545 Low back pain, unspecified: Secondary | ICD-10-CM | POA: Diagnosis not present

## 2022-06-18 DIAGNOSIS — Z6838 Body mass index (BMI) 38.0-38.9, adult: Secondary | ICD-10-CM | POA: Diagnosis not present

## 2022-06-18 DIAGNOSIS — Z79899 Other long term (current) drug therapy: Secondary | ICD-10-CM | POA: Diagnosis not present

## 2022-06-18 MED ORDER — OXYCODONE-ACETAMINOPHEN 10-325 MG PO TABS
1.0000 | ORAL_TABLET | Freq: Three times a day (TID) | ORAL | 0 refills | Status: DC | PRN
Start: 2022-06-18 — End: 2022-07-16
  Filled 2022-06-18: qty 90, 30d supply, fill #0

## 2022-06-18 MED ORDER — METHOCARBAMOL 750 MG PO TABS
1500.0000 mg | ORAL_TABLET | Freq: Three times a day (TID) | ORAL | 2 refills | Status: AC
  Filled 2022-06-18: qty 84, 14d supply, fill #0
  Filled 2022-06-27 – 2022-07-02 (×2): qty 84, 14d supply, fill #1
  Filled 2022-07-15: qty 84, 14d supply, fill #2

## 2022-06-18 MED ORDER — GABAPENTIN 300 MG PO CAPS
300.0000 mg | ORAL_CAPSULE | Freq: Three times a day (TID) | ORAL | 1 refills | Status: AC
  Filled 2022-07-06: qty 90, 30d supply, fill #0
  Filled 2022-07-15 – 2022-08-12 (×2): qty 90, 30d supply, fill #1

## 2022-06-22 ENCOUNTER — Other Ambulatory Visit (HOSPITAL_COMMUNITY): Payer: Self-pay

## 2022-06-22 MED ORDER — BACLOFEN 10 MG PO TABS
ORAL_TABLET | ORAL | 0 refills | Status: AC
  Filled 2022-06-22: qty 30, 15d supply, fill #0

## 2022-06-25 DIAGNOSIS — I10 Essential (primary) hypertension: Secondary | ICD-10-CM | POA: Diagnosis not present

## 2022-06-25 DIAGNOSIS — H524 Presbyopia: Secondary | ICD-10-CM | POA: Diagnosis not present

## 2022-06-25 DIAGNOSIS — Z0100 Encounter for examination of eyes and vision without abnormal findings: Secondary | ICD-10-CM | POA: Diagnosis not present

## 2022-06-29 ENCOUNTER — Other Ambulatory Visit (HOSPITAL_COMMUNITY): Payer: Self-pay

## 2022-07-02 ENCOUNTER — Other Ambulatory Visit: Payer: Self-pay

## 2022-07-03 ENCOUNTER — Other Ambulatory Visit: Payer: Self-pay

## 2022-07-06 ENCOUNTER — Other Ambulatory Visit (HOSPITAL_COMMUNITY): Payer: Self-pay

## 2022-07-06 ENCOUNTER — Other Ambulatory Visit: Payer: Self-pay

## 2022-07-15 ENCOUNTER — Other Ambulatory Visit (HOSPITAL_COMMUNITY): Payer: Self-pay

## 2022-07-15 ENCOUNTER — Other Ambulatory Visit: Payer: Self-pay

## 2022-07-16 ENCOUNTER — Other Ambulatory Visit (HOSPITAL_COMMUNITY): Payer: Self-pay

## 2022-07-16 DIAGNOSIS — R7303 Prediabetes: Secondary | ICD-10-CM | POA: Diagnosis not present

## 2022-07-16 DIAGNOSIS — Z6838 Body mass index (BMI) 38.0-38.9, adult: Secondary | ICD-10-CM | POA: Diagnosis not present

## 2022-07-16 DIAGNOSIS — R030 Elevated blood-pressure reading, without diagnosis of hypertension: Secondary | ICD-10-CM | POA: Diagnosis not present

## 2022-07-16 DIAGNOSIS — G894 Chronic pain syndrome: Secondary | ICD-10-CM | POA: Diagnosis not present

## 2022-07-16 DIAGNOSIS — Z9181 History of falling: Secondary | ICD-10-CM | POA: Diagnosis not present

## 2022-07-16 DIAGNOSIS — F1120 Opioid dependence, uncomplicated: Secondary | ICD-10-CM | POA: Diagnosis not present

## 2022-07-16 DIAGNOSIS — Z79899 Other long term (current) drug therapy: Secondary | ICD-10-CM | POA: Diagnosis not present

## 2022-07-16 DIAGNOSIS — M545 Low back pain, unspecified: Secondary | ICD-10-CM | POA: Diagnosis not present

## 2022-07-16 MED ORDER — OXYCODONE-ACETAMINOPHEN 10-325 MG PO TABS
1.0000 | ORAL_TABLET | Freq: Three times a day (TID) | ORAL | 0 refills | Status: DC | PRN
  Filled 2022-07-16 – 2022-07-20 (×2): qty 90, 30d supply, fill #0

## 2022-07-16 MED ORDER — METHOCARBAMOL 750 MG PO TABS
1500.0000 mg | ORAL_TABLET | Freq: Three times a day (TID) | ORAL | 2 refills | Status: AC
  Filled 2022-07-29: qty 84, 14d supply, fill #0
  Filled 2022-08-12: qty 84, 14d supply, fill #1
  Filled 2022-09-06: qty 84, 14d supply, fill #2

## 2022-07-16 MED ORDER — GABAPENTIN 300 MG PO CAPS
300.0000 mg | ORAL_CAPSULE | Freq: Three times a day (TID) | ORAL | 1 refills | Status: AC
  Filled 2022-09-06 – 2022-12-14 (×3): qty 90, 30d supply, fill #0
  Filled 2023-02-27 – 2023-03-24 (×2): qty 90, 30d supply, fill #1

## 2022-07-20 ENCOUNTER — Other Ambulatory Visit (HOSPITAL_COMMUNITY): Payer: Self-pay

## 2022-07-29 ENCOUNTER — Other Ambulatory Visit (HOSPITAL_COMMUNITY): Payer: Self-pay

## 2022-07-29 ENCOUNTER — Other Ambulatory Visit: Payer: Self-pay

## 2022-08-05 DIAGNOSIS — F3162 Bipolar disorder, current episode mixed, moderate: Secondary | ICD-10-CM | POA: Diagnosis not present

## 2022-08-05 DIAGNOSIS — F4322 Adjustment disorder with anxiety: Secondary | ICD-10-CM | POA: Diagnosis not present

## 2022-08-13 ENCOUNTER — Other Ambulatory Visit (HOSPITAL_COMMUNITY): Payer: Self-pay

## 2022-08-13 DIAGNOSIS — F1120 Opioid dependence, uncomplicated: Secondary | ICD-10-CM | POA: Diagnosis not present

## 2022-08-13 DIAGNOSIS — M545 Low back pain, unspecified: Secondary | ICD-10-CM | POA: Diagnosis not present

## 2022-08-13 DIAGNOSIS — R7303 Prediabetes: Secondary | ICD-10-CM | POA: Diagnosis not present

## 2022-08-13 DIAGNOSIS — Z79899 Other long term (current) drug therapy: Secondary | ICD-10-CM | POA: Diagnosis not present

## 2022-08-13 DIAGNOSIS — Z6838 Body mass index (BMI) 38.0-38.9, adult: Secondary | ICD-10-CM | POA: Diagnosis not present

## 2022-08-13 DIAGNOSIS — G894 Chronic pain syndrome: Secondary | ICD-10-CM | POA: Diagnosis not present

## 2022-08-13 DIAGNOSIS — Z9181 History of falling: Secondary | ICD-10-CM | POA: Diagnosis not present

## 2022-08-13 DIAGNOSIS — R030 Elevated blood-pressure reading, without diagnosis of hypertension: Secondary | ICD-10-CM | POA: Diagnosis not present

## 2022-08-13 MED ORDER — OXYCODONE-ACETAMINOPHEN 10-325 MG PO TABS
1.0000 | ORAL_TABLET | Freq: Three times a day (TID) | ORAL | 0 refills | Status: DC | PRN
Start: 2022-08-18 — End: 2022-09-10
  Filled 2022-08-13 – 2022-08-18 (×2): qty 90, 30d supply, fill #0

## 2022-08-15 DIAGNOSIS — G894 Chronic pain syndrome: Secondary | ICD-10-CM | POA: Diagnosis not present

## 2022-08-15 DIAGNOSIS — M5136 Other intervertebral disc degeneration, lumbar region: Secondary | ICD-10-CM | POA: Diagnosis not present

## 2022-08-18 ENCOUNTER — Other Ambulatory Visit (HOSPITAL_COMMUNITY): Payer: Self-pay

## 2022-08-18 ENCOUNTER — Other Ambulatory Visit: Payer: Self-pay

## 2022-08-19 DIAGNOSIS — Z4651 Encounter for fitting and adjustment of gastric lap band: Secondary | ICD-10-CM | POA: Diagnosis not present

## 2022-08-19 DIAGNOSIS — Z9884 Bariatric surgery status: Secondary | ICD-10-CM | POA: Diagnosis not present

## 2022-09-07 ENCOUNTER — Other Ambulatory Visit: Payer: Self-pay

## 2022-09-10 ENCOUNTER — Other Ambulatory Visit (HOSPITAL_COMMUNITY): Payer: Self-pay

## 2022-09-10 DIAGNOSIS — R7303 Prediabetes: Secondary | ICD-10-CM | POA: Diagnosis not present

## 2022-09-10 DIAGNOSIS — Z6841 Body Mass Index (BMI) 40.0 and over, adult: Secondary | ICD-10-CM | POA: Diagnosis not present

## 2022-09-10 DIAGNOSIS — Z6839 Body mass index (BMI) 39.0-39.9, adult: Secondary | ICD-10-CM | POA: Diagnosis not present

## 2022-09-10 DIAGNOSIS — R030 Elevated blood-pressure reading, without diagnosis of hypertension: Secondary | ICD-10-CM | POA: Diagnosis not present

## 2022-09-10 DIAGNOSIS — F1120 Opioid dependence, uncomplicated: Secondary | ICD-10-CM | POA: Diagnosis not present

## 2022-09-10 DIAGNOSIS — Z79899 Other long term (current) drug therapy: Secondary | ICD-10-CM | POA: Diagnosis not present

## 2022-09-10 DIAGNOSIS — G894 Chronic pain syndrome: Secondary | ICD-10-CM | POA: Diagnosis not present

## 2022-09-10 DIAGNOSIS — M545 Low back pain, unspecified: Secondary | ICD-10-CM | POA: Diagnosis not present

## 2022-09-10 MED ORDER — METHOCARBAMOL 750 MG PO TABS
1500.0000 mg | ORAL_TABLET | Freq: Three times a day (TID) | ORAL | 2 refills | Status: AC
  Filled 2022-09-10 – 2022-10-06 (×4): qty 84, 14d supply, fill #0
  Filled 2022-10-15 – 2022-10-20 (×3): qty 84, 14d supply, fill #1
  Filled 2022-10-29 – 2022-11-26 (×2): qty 84, 14d supply, fill #2

## 2022-09-10 MED ORDER — OXYCODONE-ACETAMINOPHEN 10-325 MG PO TABS
1.0000 | ORAL_TABLET | Freq: Three times a day (TID) | ORAL | 0 refills | Status: DC | PRN
  Filled 2022-09-10 – 2022-09-18 (×2): qty 90, 30d supply, fill #0

## 2022-09-10 MED ORDER — GABAPENTIN 300 MG PO CAPS
300.0000 mg | ORAL_CAPSULE | Freq: Three times a day (TID) | ORAL | 1 refills | Status: AC
  Filled 2022-09-10: qty 90, 30d supply, fill #0

## 2022-09-15 DIAGNOSIS — Z79899 Other long term (current) drug therapy: Secondary | ICD-10-CM | POA: Diagnosis not present

## 2022-09-15 DIAGNOSIS — G894 Chronic pain syndrome: Secondary | ICD-10-CM | POA: Diagnosis not present

## 2022-09-15 DIAGNOSIS — M5136 Other intervertebral disc degeneration, lumbar region: Secondary | ICD-10-CM | POA: Diagnosis not present

## 2022-09-16 ENCOUNTER — Other Ambulatory Visit (HOSPITAL_COMMUNITY): Payer: Self-pay

## 2022-09-16 DIAGNOSIS — Z9884 Bariatric surgery status: Secondary | ICD-10-CM | POA: Diagnosis not present

## 2022-09-16 DIAGNOSIS — Z4651 Encounter for fitting and adjustment of gastric lap band: Secondary | ICD-10-CM | POA: Diagnosis not present

## 2022-09-18 ENCOUNTER — Other Ambulatory Visit (HOSPITAL_COMMUNITY): Payer: Self-pay

## 2022-10-03 ENCOUNTER — Encounter (HOSPITAL_COMMUNITY): Payer: Self-pay

## 2022-10-03 ENCOUNTER — Other Ambulatory Visit: Payer: Self-pay

## 2022-10-03 ENCOUNTER — Emergency Department (HOSPITAL_COMMUNITY)
Admission: EM | Admit: 2022-10-03 | Discharge: 2022-10-03 | Disposition: A | Discharge status: Home / Self Care | Payer: Medicare HMO | Attending: Student | Admitting: Student

## 2022-10-03 ENCOUNTER — Emergency Department (HOSPITAL_COMMUNITY): Payer: Medicare HMO

## 2022-10-03 DIAGNOSIS — R112 Nausea with vomiting, unspecified: Secondary | ICD-10-CM

## 2022-10-03 DIAGNOSIS — E039 Hypothyroidism, unspecified: Secondary | ICD-10-CM | POA: Diagnosis not present

## 2022-10-03 DIAGNOSIS — Z4651 Encounter for fitting and adjustment of gastric lap band: Secondary | ICD-10-CM | POA: Diagnosis not present

## 2022-10-03 DIAGNOSIS — J45909 Unspecified asthma, uncomplicated: Secondary | ICD-10-CM | POA: Diagnosis not present

## 2022-10-03 DIAGNOSIS — Z79899 Other long term (current) drug therapy: Secondary | ICD-10-CM | POA: Diagnosis not present

## 2022-10-03 LAB — COMPREHENSIVE METABOLIC PANEL
ALT: 27 U/L (ref 0–44)
AST: 41 U/L (ref 15–41)
Albumin: 4.2 g/dL (ref 3.5–5.0)
Alkaline Phosphatase: 61 U/L (ref 38–126)
Anion gap: 9 (ref 5–15)
BUN: 20 mg/dL (ref 6–20)
CO2: 22 mmol/L (ref 22–32)
Calcium: 8.9 mg/dL (ref 8.9–10.3)
Chloride: 102 mmol/L (ref 98–111)
Creatinine, Ser: 1.09 mg/dL — ABNORMAL HIGH (ref 0.44–1.00)
GFR, Estimated: >60 mL/min (ref >60)
Glucose, Bld: 96 mg/dL (ref 70–99)
Potassium: 4.7 mmol/L (ref 3.5–5.1)
Sodium: 133 mmol/L — ABNORMAL LOW (ref 135–145)
Total Bilirubin: 1.4 mg/dL — ABNORMAL HIGH (ref 0.3–1.2)
Total Protein: 8.5 g/dL — ABNORMAL HIGH (ref 6.5–8.1)

## 2022-10-03 LAB — CBC WITH DIFFERENTIAL/PLATELET
Abs Immature Granulocytes: 0.02 10*3/uL (ref 0.00–0.07)
Basophils Absolute: 0.1 10*3/uL (ref 0.0–0.1)
Basophils Relative: 1 %
Eosinophils Absolute: 0.1 10*3/uL (ref 0.0–0.5)
Eosinophils Relative: 1 %
HCT: 43.9 % (ref 36.0–46.0)
Hemoglobin: 14.5 g/dL (ref 12.0–15.0)
Immature Granulocytes: 0 %
Lymphocytes Relative: 25 %
Lymphs Abs: 1.9 10*3/uL (ref 0.7–4.0)
MCH: 29.1 pg (ref 26.0–34.0)
MCHC: 33 g/dL (ref 30.0–36.0)
MCV: 88.2 fL (ref 80.0–100.0)
Monocytes Absolute: 0.6 10*3/uL (ref 0.1–1.0)
Monocytes Relative: 7 %
Neutro Abs: 4.9 10*3/uL (ref 1.7–7.7)
Neutrophils Relative %: 66 %
Platelets: 213 10*3/uL (ref 150–400)
RBC: 4.98 MIL/uL (ref 3.87–5.11)
RDW: 14.1 % (ref 11.5–15.5)
WBC: 7.5 10*3/uL (ref 4.0–10.5)
nRBC: 0 % (ref 0.0–0.2)

## 2022-10-03 MED ORDER — GABAPENTIN 300 MG PO CAPS
300.0000 mg | ORAL_CAPSULE | Freq: Three times a day (TID) | ORAL | Status: DC
  Administered 2022-10-03: 300 mg via ORAL
  Filled 2022-10-03: qty 1

## 2022-10-03 MED ORDER — OXYCODONE HCL 5 MG PO TABS
5.0000 mg | ORAL_TABLET | Freq: Once | ORAL | Status: AC
  Administered 2022-10-03: 5 mg via ORAL
  Filled 2022-10-03: qty 1

## 2022-10-03 MED ORDER — ONDANSETRON HCL 4 MG/2ML IJ SOLN
4.0000 mg | Freq: Once | INTRAMUSCULAR | Status: AC
  Administered 2022-10-03: 4 mg via INTRAVENOUS
  Filled 2022-10-03: qty 2

## 2022-10-03 MED ORDER — ARIPIPRAZOLE 10 MG PO TABS
30.0000 mg | ORAL_TABLET | Freq: Every day | ORAL | Status: DC
  Administered 2022-10-03: 30 mg via ORAL
  Filled 2022-10-03: qty 3

## 2022-10-03 MED ORDER — LACTATED RINGERS IV BOLUS
1000.0000 mL | Freq: Once | INTRAVENOUS | Status: AC
  Administered 2022-10-03: 1000 mL via INTRAVENOUS

## 2022-10-03 MED ORDER — ONDANSETRON 4 MG PO TBDP: 4.0000 mg | ORAL_TABLET | Freq: Three times a day (TID) | ORAL | 0 refills | Status: AC | PRN
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
## 2022-10-03 NOTE — ED Triage Notes (Signed)
Pt reports having 0.5 toal removed from lap band yesterday to help with n/v.  Pt reports lap band was filled two months ago. Lap band was performed In 2022.  Started having n/v again last night.
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
## 2022-10-06 ENCOUNTER — Other Ambulatory Visit: Payer: Self-pay

## 2022-10-07 DIAGNOSIS — G43009 Migraine without aura, not intractable, without status migrainosus: Secondary | ICD-10-CM | POA: Diagnosis not present

## 2022-10-07 DIAGNOSIS — E119 Type 2 diabetes mellitus without complications: Secondary | ICD-10-CM | POA: Diagnosis not present

## 2022-10-07 DIAGNOSIS — Z0000 Encounter for general adult medical examination without abnormal findings: Secondary | ICD-10-CM | POA: Diagnosis not present

## 2022-10-07 DIAGNOSIS — E039 Hypothyroidism, unspecified: Secondary | ICD-10-CM | POA: Diagnosis not present

## 2022-10-07 DIAGNOSIS — N3281 Overactive bladder: Secondary | ICD-10-CM | POA: Diagnosis not present

## 2022-10-07 DIAGNOSIS — E7800 Pure hypercholesterolemia, unspecified: Secondary | ICD-10-CM | POA: Diagnosis not present

## 2022-10-07 DIAGNOSIS — G4733 Obstructive sleep apnea (adult) (pediatric): Secondary | ICD-10-CM | POA: Diagnosis not present

## 2022-10-07 DIAGNOSIS — M62830 Muscle spasm of back: Secondary | ICD-10-CM | POA: Diagnosis not present

## 2022-10-07 DIAGNOSIS — E1169 Type 2 diabetes mellitus with other specified complication: Secondary | ICD-10-CM | POA: Diagnosis not present

## 2022-10-07 DIAGNOSIS — E282 Polycystic ovarian syndrome: Secondary | ICD-10-CM | POA: Diagnosis not present

## 2022-10-09 DIAGNOSIS — Z9884 Bariatric surgery status: Secondary | ICD-10-CM | POA: Diagnosis not present

## 2022-10-09 DIAGNOSIS — Z4651 Encounter for fitting and adjustment of gastric lap band: Secondary | ICD-10-CM | POA: Diagnosis not present

## 2022-10-15 ENCOUNTER — Other Ambulatory Visit (HOSPITAL_COMMUNITY): Payer: Self-pay

## 2022-10-15 DIAGNOSIS — R7303 Prediabetes: Secondary | ICD-10-CM | POA: Diagnosis not present

## 2022-10-15 DIAGNOSIS — Z79899 Other long term (current) drug therapy: Secondary | ICD-10-CM | POA: Diagnosis not present

## 2022-10-15 DIAGNOSIS — R030 Elevated blood-pressure reading, without diagnosis of hypertension: Secondary | ICD-10-CM | POA: Diagnosis not present

## 2022-10-15 DIAGNOSIS — M5136 Other intervertebral disc degeneration, lumbar region: Secondary | ICD-10-CM | POA: Diagnosis not present

## 2022-10-15 DIAGNOSIS — Z6839 Body mass index (BMI) 39.0-39.9, adult: Secondary | ICD-10-CM | POA: Diagnosis not present

## 2022-10-15 DIAGNOSIS — G894 Chronic pain syndrome: Secondary | ICD-10-CM | POA: Diagnosis not present

## 2022-10-15 DIAGNOSIS — F1120 Opioid dependence, uncomplicated: Secondary | ICD-10-CM | POA: Diagnosis not present

## 2022-10-15 DIAGNOSIS — Z9181 History of falling: Secondary | ICD-10-CM | POA: Diagnosis not present

## 2022-10-15 DIAGNOSIS — M545 Low back pain, unspecified: Secondary | ICD-10-CM | POA: Diagnosis not present

## 2022-10-15 MED ORDER — OXYCODONE-ACETAMINOPHEN 10-325 MG PO TABS
1.0000 | ORAL_TABLET | Freq: Three times a day (TID) | ORAL | 0 refills | Status: DC | PRN
  Filled 2022-10-15 – 2022-10-20 (×2): qty 90, 30d supply, fill #0

## 2022-10-15 MED ORDER — GABAPENTIN 300 MG PO CAPS
300.0000 mg | ORAL_CAPSULE | Freq: Three times a day (TID) | ORAL | 1 refills | Status: AC
  Filled 2022-10-15: qty 90, 30d supply, fill #0

## 2022-10-15 MED ORDER — METHOCARBAMOL 750 MG PO TABS
1500.0000 mg | ORAL_TABLET | Freq: Three times a day (TID) | ORAL | 2 refills | Status: AC
  Filled 2022-10-15 – 2022-12-10 (×2): qty 84, 14d supply, fill #0
  Filled 2022-12-24: qty 84, 14d supply, fill #1
  Filled 2023-01-03 – 2023-01-04 (×2): qty 84, 14d supply, fill #2

## 2022-10-20 ENCOUNTER — Other Ambulatory Visit: Payer: Self-pay

## 2022-10-20 ENCOUNTER — Other Ambulatory Visit (HOSPITAL_COMMUNITY): Payer: Self-pay

## 2022-10-21 ENCOUNTER — Other Ambulatory Visit (HOSPITAL_COMMUNITY): Payer: Self-pay

## 2022-10-27 DIAGNOSIS — F3162 Bipolar disorder, current episode mixed, moderate: Secondary | ICD-10-CM | POA: Diagnosis not present

## 2022-10-27 DIAGNOSIS — F4322 Adjustment disorder with anxiety: Secondary | ICD-10-CM | POA: Diagnosis not present

## 2022-10-29 ENCOUNTER — Other Ambulatory Visit (HOSPITAL_COMMUNITY): Payer: Self-pay

## 2022-11-06 DIAGNOSIS — Z9884 Bariatric surgery status: Secondary | ICD-10-CM | POA: Diagnosis not present

## 2022-11-06 DIAGNOSIS — Z4651 Encounter for fitting and adjustment of gastric lap band: Secondary | ICD-10-CM | POA: Diagnosis not present

## 2022-11-15 DIAGNOSIS — M5136 Other intervertebral disc degeneration, lumbar region: Secondary | ICD-10-CM | POA: Diagnosis not present

## 2022-11-15 DIAGNOSIS — G894 Chronic pain syndrome: Secondary | ICD-10-CM | POA: Diagnosis not present

## 2022-11-16 ENCOUNTER — Other Ambulatory Visit (HOSPITAL_COMMUNITY): Payer: Self-pay

## 2022-11-16 DIAGNOSIS — F1120 Opioid dependence, uncomplicated: Secondary | ICD-10-CM | POA: Diagnosis not present

## 2022-11-16 DIAGNOSIS — M5136 Other intervertebral disc degeneration, lumbar region: Secondary | ICD-10-CM | POA: Diagnosis not present

## 2022-11-16 DIAGNOSIS — Z6841 Body Mass Index (BMI) 40.0 and over, adult: Secondary | ICD-10-CM | POA: Diagnosis not present

## 2022-11-16 DIAGNOSIS — G894 Chronic pain syndrome: Secondary | ICD-10-CM | POA: Diagnosis not present

## 2022-11-16 DIAGNOSIS — M545 Low back pain, unspecified: Secondary | ICD-10-CM | POA: Diagnosis not present

## 2022-11-16 DIAGNOSIS — Z9181 History of falling: Secondary | ICD-10-CM | POA: Diagnosis not present

## 2022-11-16 DIAGNOSIS — Z79899 Other long term (current) drug therapy: Secondary | ICD-10-CM | POA: Diagnosis not present

## 2022-11-16 DIAGNOSIS — R7303 Prediabetes: Secondary | ICD-10-CM | POA: Diagnosis not present

## 2022-11-16 DIAGNOSIS — R030 Elevated blood-pressure reading, without diagnosis of hypertension: Secondary | ICD-10-CM | POA: Diagnosis not present

## 2022-11-16 MED ORDER — METHOCARBAMOL 750 MG PO TABS
1500.0000 mg | ORAL_TABLET | Freq: Three times a day (TID) | ORAL | 2 refills | Status: AC
  Filled 2022-11-16: qty 84, 14d supply, fill #0
  Filled 2023-01-17 – 2023-03-14 (×3): qty 84, 14d supply, fill #1
  Filled 2023-03-24 – 2023-04-11 (×2): qty 84, 14d supply, fill #2

## 2022-11-16 MED ORDER — GABAPENTIN 300 MG PO CAPS
300.0000 mg | ORAL_CAPSULE | Freq: Three times a day (TID) | ORAL | 1 refills | Status: AC
  Filled 2022-11-16 – 2022-11-19 (×2): qty 90, 30d supply, fill #0

## 2022-11-16 MED ORDER — BACLOFEN 10 MG PO TABS
10.0000 mg | ORAL_TABLET | Freq: Two times a day (BID) | ORAL | 0 refills | Status: AC | PRN
  Filled 2022-11-16: qty 28, 14d supply, fill #0

## 2022-11-16 MED ORDER — OXYCODONE-ACETAMINOPHEN 10-325 MG PO TABS
1.0000 | ORAL_TABLET | Freq: Three times a day (TID) | ORAL | 0 refills | Status: AC | PRN
  Filled 2022-11-16 – 2022-11-19 (×2): qty 90, 30d supply, fill #0

## 2022-11-19 ENCOUNTER — Other Ambulatory Visit (HOSPITAL_COMMUNITY): Payer: Self-pay

## 2022-11-19 ENCOUNTER — Other Ambulatory Visit: Payer: Self-pay

## 2022-11-24 DIAGNOSIS — M79604 Pain in right leg: Secondary | ICD-10-CM | POA: Diagnosis not present

## 2022-11-24 DIAGNOSIS — Z6841 Body Mass Index (BMI) 40.0 and over, adult: Secondary | ICD-10-CM | POA: Diagnosis not present

## 2022-11-27 ENCOUNTER — Other Ambulatory Visit (HOSPITAL_COMMUNITY): Payer: Self-pay

## 2022-11-27 DIAGNOSIS — M79661 Pain in right lower leg: Secondary | ICD-10-CM | POA: Diagnosis not present

## 2022-11-27 DIAGNOSIS — M545 Low back pain, unspecified: Secondary | ICD-10-CM | POA: Diagnosis not present

## 2022-12-05 DIAGNOSIS — M545 Low back pain, unspecified: Secondary | ICD-10-CM | POA: Diagnosis not present

## 2022-12-05 DIAGNOSIS — M5126 Other intervertebral disc displacement, lumbar region: Secondary | ICD-10-CM | POA: Diagnosis not present

## 2022-12-05 DIAGNOSIS — M5136 Other intervertebral disc degeneration, lumbar region: Secondary | ICD-10-CM | POA: Diagnosis not present

## 2022-12-05 DIAGNOSIS — M48061 Spinal stenosis, lumbar region without neurogenic claudication: Secondary | ICD-10-CM | POA: Diagnosis not present

## 2022-12-05 DIAGNOSIS — M5137 Other intervertebral disc degeneration, lumbosacral region: Secondary | ICD-10-CM | POA: Diagnosis not present

## 2022-12-10 ENCOUNTER — Other Ambulatory Visit: Payer: Self-pay

## 2022-12-10 ENCOUNTER — Other Ambulatory Visit (HOSPITAL_COMMUNITY): Payer: Self-pay

## 2022-12-14 ENCOUNTER — Other Ambulatory Visit: Payer: Self-pay

## 2022-12-14 DIAGNOSIS — M79661 Pain in right lower leg: Secondary | ICD-10-CM | POA: Diagnosis not present

## 2022-12-15 DIAGNOSIS — M79604 Pain in right leg: Secondary | ICD-10-CM | POA: Diagnosis not present

## 2022-12-15 DIAGNOSIS — R531 Weakness: Secondary | ICD-10-CM | POA: Diagnosis not present

## 2022-12-15 DIAGNOSIS — M5136 Other intervertebral disc degeneration, lumbar region: Secondary | ICD-10-CM | POA: Diagnosis not present

## 2022-12-15 DIAGNOSIS — G894 Chronic pain syndrome: Secondary | ICD-10-CM | POA: Diagnosis not present

## 2022-12-15 DIAGNOSIS — M5416 Radiculopathy, lumbar region: Secondary | ICD-10-CM | POA: Diagnosis not present

## 2022-12-16 ENCOUNTER — Other Ambulatory Visit (HOSPITAL_COMMUNITY): Payer: Self-pay

## 2022-12-16 DIAGNOSIS — Z9181 History of falling: Secondary | ICD-10-CM | POA: Diagnosis not present

## 2022-12-16 DIAGNOSIS — Z6841 Body Mass Index (BMI) 40.0 and over, adult: Secondary | ICD-10-CM | POA: Diagnosis not present

## 2022-12-16 DIAGNOSIS — R7303 Prediabetes: Secondary | ICD-10-CM | POA: Diagnosis not present

## 2022-12-16 DIAGNOSIS — M5136 Other intervertebral disc degeneration, lumbar region: Secondary | ICD-10-CM | POA: Diagnosis not present

## 2022-12-16 DIAGNOSIS — M545 Low back pain, unspecified: Secondary | ICD-10-CM | POA: Diagnosis not present

## 2022-12-16 DIAGNOSIS — Z79899 Other long term (current) drug therapy: Secondary | ICD-10-CM | POA: Diagnosis not present

## 2022-12-16 DIAGNOSIS — G894 Chronic pain syndrome: Secondary | ICD-10-CM | POA: Diagnosis not present

## 2022-12-16 DIAGNOSIS — R030 Elevated blood-pressure reading, without diagnosis of hypertension: Secondary | ICD-10-CM | POA: Diagnosis not present

## 2022-12-16 DIAGNOSIS — F1120 Opioid dependence, uncomplicated: Secondary | ICD-10-CM | POA: Diagnosis not present

## 2022-12-16 MED ORDER — METHOCARBAMOL 750 MG PO TABS
1500.0000 mg | ORAL_TABLET | Freq: Three times a day (TID) | ORAL | 2 refills | Status: AC
  Filled 2022-12-16: qty 84, 14d supply, fill #0

## 2022-12-16 MED ORDER — BACLOFEN 10 MG PO TABS
10.0000 mg | ORAL_TABLET | Freq: Two times a day (BID) | ORAL | 2 refills | Status: AC | PRN
  Filled 2022-12-16: qty 28, 14d supply, fill #0

## 2022-12-16 MED ORDER — GABAPENTIN 300 MG PO CAPS
300.0000 mg | ORAL_CAPSULE | Freq: Three times a day (TID) | ORAL | 1 refills | Status: AC
  Filled 2022-12-16: qty 90, 30d supply, fill #0

## 2022-12-16 MED ORDER — OXYCODONE-ACETAMINOPHEN 10-325 MG PO TABS
1.0000 | ORAL_TABLET | Freq: Three times a day (TID) | ORAL | 0 refills | Status: AC | PRN
  Filled 2022-12-16 – 2022-12-21 (×2): qty 90, 30d supply, fill #0

## 2022-12-18 DIAGNOSIS — Z4651 Encounter for fitting and adjustment of gastric lap band: Secondary | ICD-10-CM | POA: Diagnosis not present

## 2022-12-21 ENCOUNTER — Other Ambulatory Visit (HOSPITAL_COMMUNITY): Payer: Self-pay

## 2022-12-21 ENCOUNTER — Other Ambulatory Visit: Payer: Self-pay

## 2022-12-23 DIAGNOSIS — M79604 Pain in right leg: Secondary | ICD-10-CM | POA: Diagnosis not present

## 2022-12-23 DIAGNOSIS — M5416 Radiculopathy, lumbar region: Secondary | ICD-10-CM | POA: Diagnosis not present

## 2022-12-23 DIAGNOSIS — R531 Weakness: Secondary | ICD-10-CM | POA: Diagnosis not present

## 2022-12-24 ENCOUNTER — Other Ambulatory Visit (HOSPITAL_COMMUNITY): Payer: Self-pay

## 2022-12-30 DIAGNOSIS — M79604 Pain in right leg: Secondary | ICD-10-CM | POA: Diagnosis not present

## 2022-12-30 DIAGNOSIS — R531 Weakness: Secondary | ICD-10-CM | POA: Diagnosis not present

## 2022-12-30 DIAGNOSIS — M5416 Radiculopathy, lumbar region: Secondary | ICD-10-CM | POA: Diagnosis not present

## 2023-01-03 ENCOUNTER — Other Ambulatory Visit: Payer: Self-pay

## 2023-01-03 ENCOUNTER — Other Ambulatory Visit (HOSPITAL_COMMUNITY): Payer: Self-pay

## 2023-01-06 DIAGNOSIS — M79604 Pain in right leg: Secondary | ICD-10-CM | POA: Diagnosis not present

## 2023-01-06 DIAGNOSIS — M5416 Radiculopathy, lumbar region: Secondary | ICD-10-CM | POA: Diagnosis not present

## 2023-01-06 DIAGNOSIS — R531 Weakness: Secondary | ICD-10-CM | POA: Diagnosis not present

## 2023-01-13 ENCOUNTER — Other Ambulatory Visit (HOSPITAL_COMMUNITY): Payer: Self-pay

## 2023-01-13 DIAGNOSIS — Z79899 Other long term (current) drug therapy: Secondary | ICD-10-CM | POA: Diagnosis not present

## 2023-01-13 DIAGNOSIS — M5136 Other intervertebral disc degeneration, lumbar region: Secondary | ICD-10-CM | POA: Diagnosis not present

## 2023-01-13 DIAGNOSIS — Z6841 Body Mass Index (BMI) 40.0 and over, adult: Secondary | ICD-10-CM | POA: Diagnosis not present

## 2023-01-13 DIAGNOSIS — M545 Low back pain, unspecified: Secondary | ICD-10-CM | POA: Diagnosis not present

## 2023-01-13 DIAGNOSIS — F1120 Opioid dependence, uncomplicated: Secondary | ICD-10-CM | POA: Diagnosis not present

## 2023-01-13 DIAGNOSIS — R030 Elevated blood-pressure reading, without diagnosis of hypertension: Secondary | ICD-10-CM | POA: Diagnosis not present

## 2023-01-13 DIAGNOSIS — R7303 Prediabetes: Secondary | ICD-10-CM | POA: Diagnosis not present

## 2023-01-13 DIAGNOSIS — G894 Chronic pain syndrome: Secondary | ICD-10-CM | POA: Diagnosis not present

## 2023-01-13 MED ORDER — METHOCARBAMOL 750 MG PO TABS
1500.0000 mg | ORAL_TABLET | Freq: Three times a day (TID) | ORAL | 2 refills | Status: AC
  Filled 2023-01-13 – 2023-01-15 (×2): qty 84, 14d supply, fill #0

## 2023-01-13 MED ORDER — OXYCODONE-ACETAMINOPHEN 10-325 MG PO TABS
1.0000 | ORAL_TABLET | Freq: Three times a day (TID) | ORAL | 0 refills | Status: AC | PRN
  Filled 2023-01-13 – 2023-01-20 (×4): qty 90, 30d supply, fill #0

## 2023-01-13 MED ORDER — BACLOFEN 10 MG PO TABS
10.0000 mg | ORAL_TABLET | Freq: Two times a day (BID) | ORAL | 2 refills | Status: AC | PRN
  Filled 2023-01-13: qty 28, 14d supply, fill #0

## 2023-01-13 MED ORDER — GABAPENTIN 300 MG PO CAPS
300.0000 mg | ORAL_CAPSULE | Freq: Two times a day (BID) | ORAL | 1 refills | Status: AC
  Filled 2023-01-13: qty 60, 30d supply, fill #0
  Filled 2023-03-14: qty 60, 30d supply, fill #1

## 2023-01-14 DIAGNOSIS — R531 Weakness: Secondary | ICD-10-CM | POA: Diagnosis not present

## 2023-01-14 DIAGNOSIS — M79604 Pain in right leg: Secondary | ICD-10-CM | POA: Diagnosis not present

## 2023-01-14 DIAGNOSIS — M5416 Radiculopathy, lumbar region: Secondary | ICD-10-CM | POA: Diagnosis not present

## 2023-01-15 ENCOUNTER — Other Ambulatory Visit (HOSPITAL_COMMUNITY): Payer: Self-pay

## 2023-01-15 DIAGNOSIS — M5136 Other intervertebral disc degeneration, lumbar region: Secondary | ICD-10-CM | POA: Diagnosis not present

## 2023-01-15 DIAGNOSIS — G894 Chronic pain syndrome: Secondary | ICD-10-CM | POA: Diagnosis not present

## 2023-01-15 DIAGNOSIS — Z79899 Other long term (current) drug therapy: Secondary | ICD-10-CM | POA: Diagnosis not present

## 2023-01-18 ENCOUNTER — Other Ambulatory Visit (HOSPITAL_COMMUNITY): Payer: Self-pay

## 2023-01-18 DIAGNOSIS — M5416 Radiculopathy, lumbar region: Secondary | ICD-10-CM | POA: Diagnosis not present

## 2023-01-19 ENCOUNTER — Other Ambulatory Visit: Payer: Self-pay

## 2023-01-19 ENCOUNTER — Other Ambulatory Visit (HOSPITAL_COMMUNITY): Payer: Self-pay

## 2023-01-19 DIAGNOSIS — F4322 Adjustment disorder with anxiety: Secondary | ICD-10-CM | POA: Diagnosis not present

## 2023-01-19 DIAGNOSIS — F3162 Bipolar disorder, current episode mixed, moderate: Secondary | ICD-10-CM | POA: Diagnosis not present

## 2023-01-20 ENCOUNTER — Other Ambulatory Visit (HOSPITAL_COMMUNITY): Payer: Self-pay

## 2023-01-21 DIAGNOSIS — Z1231 Encounter for screening mammogram for malignant neoplasm of breast: Secondary | ICD-10-CM | POA: Diagnosis not present

## 2023-01-21 DIAGNOSIS — Z113 Encounter for screening for infections with a predominantly sexual mode of transmission: Secondary | ICD-10-CM | POA: Diagnosis not present

## 2023-01-21 DIAGNOSIS — Z309 Encounter for contraceptive management, unspecified: Secondary | ICD-10-CM | POA: Diagnosis not present

## 2023-01-21 DIAGNOSIS — Z01419 Encounter for gynecological examination (general) (routine) without abnormal findings: Secondary | ICD-10-CM | POA: Diagnosis not present

## 2023-01-21 DIAGNOSIS — N946 Dysmenorrhea, unspecified: Secondary | ICD-10-CM | POA: Diagnosis not present

## 2023-01-21 DIAGNOSIS — Z01411 Encounter for gynecological examination (general) (routine) with abnormal findings: Secondary | ICD-10-CM | POA: Diagnosis not present

## 2023-01-21 DIAGNOSIS — Z124 Encounter for screening for malignant neoplasm of cervix: Secondary | ICD-10-CM | POA: Diagnosis not present

## 2023-02-08 DIAGNOSIS — F3162 Bipolar disorder, current episode mixed, moderate: Secondary | ICD-10-CM | POA: Diagnosis not present

## 2023-02-08 DIAGNOSIS — F4322 Adjustment disorder with anxiety: Secondary | ICD-10-CM | POA: Diagnosis not present

## 2023-02-12 DIAGNOSIS — B356 Tinea cruris: Secondary | ICD-10-CM | POA: Diagnosis not present

## 2023-02-15 DIAGNOSIS — M5136 Other intervertebral disc degeneration, lumbar region: Secondary | ICD-10-CM | POA: Diagnosis not present

## 2023-02-15 DIAGNOSIS — G894 Chronic pain syndrome: Secondary | ICD-10-CM | POA: Diagnosis not present

## 2023-02-17 ENCOUNTER — Other Ambulatory Visit: Payer: Self-pay

## 2023-02-17 ENCOUNTER — Other Ambulatory Visit (HOSPITAL_COMMUNITY): Payer: Self-pay

## 2023-02-17 DIAGNOSIS — M5136 Other intervertebral disc degeneration, lumbar region: Secondary | ICD-10-CM | POA: Diagnosis not present

## 2023-02-17 DIAGNOSIS — G894 Chronic pain syndrome: Secondary | ICD-10-CM | POA: Diagnosis not present

## 2023-02-17 DIAGNOSIS — F1120 Opioid dependence, uncomplicated: Secondary | ICD-10-CM | POA: Diagnosis not present

## 2023-02-17 DIAGNOSIS — M129 Arthropathy, unspecified: Secondary | ICD-10-CM | POA: Diagnosis not present

## 2023-02-17 DIAGNOSIS — Z6841 Body Mass Index (BMI) 40.0 and over, adult: Secondary | ICD-10-CM | POA: Diagnosis not present

## 2023-02-17 DIAGNOSIS — Z79899 Other long term (current) drug therapy: Secondary | ICD-10-CM | POA: Diagnosis not present

## 2023-02-17 DIAGNOSIS — M545 Low back pain, unspecified: Secondary | ICD-10-CM | POA: Diagnosis not present

## 2023-02-17 MED ORDER — BACLOFEN 10 MG PO TABS
10.0000 mg | ORAL_TABLET | Freq: Two times a day (BID) | ORAL | 2 refills | Status: AC | PRN
  Filled 2023-02-17: qty 28, 14d supply, fill #0

## 2023-02-17 MED ORDER — OXYCODONE-ACETAMINOPHEN 10-325 MG PO TABS
1.0000 | ORAL_TABLET | Freq: Three times a day (TID) | ORAL | 0 refills | Status: AC | PRN
  Filled 2023-02-17: qty 90, 30d supply, fill #0

## 2023-02-17 MED ORDER — GABAPENTIN 300 MG PO CAPS
300.0000 mg | ORAL_CAPSULE | Freq: Two times a day (BID) | ORAL | 0 refills | Status: AC
  Filled 2023-02-17: qty 60, 30d supply, fill #0

## 2023-02-17 MED ORDER — NALOXONE HCL 4 MG/0.1ML NA LIQD
1.0000 | NASAL | 1 refills | Status: AC | PRN
  Filled 2023-02-17: qty 2, 28d supply, fill #0

## 2023-02-17 MED ORDER — METHOCARBAMOL 750 MG PO TABS
1500.0000 mg | ORAL_TABLET | Freq: Three times a day (TID) | ORAL | 0 refills | Status: AC
  Filled 2023-02-17: qty 84, 14d supply, fill #0

## 2023-02-23 DIAGNOSIS — F3162 Bipolar disorder, current episode mixed, moderate: Secondary | ICD-10-CM | POA: Diagnosis not present

## 2023-02-23 DIAGNOSIS — F4322 Adjustment disorder with anxiety: Secondary | ICD-10-CM | POA: Diagnosis not present

## 2023-02-27 ENCOUNTER — Other Ambulatory Visit (HOSPITAL_COMMUNITY): Payer: Self-pay

## 2023-03-04 DIAGNOSIS — R309 Painful micturition, unspecified: Secondary | ICD-10-CM | POA: Diagnosis not present

## 2023-03-04 DIAGNOSIS — N390 Urinary tract infection, site not specified: Secondary | ICD-10-CM | POA: Diagnosis not present

## 2023-03-09 ENCOUNTER — Other Ambulatory Visit (HOSPITAL_COMMUNITY): Payer: Self-pay

## 2023-03-09 MED ORDER — ARIPIPRAZOLE 30 MG PO TABS
30.0000 mg | ORAL_TABLET | Freq: Every day | ORAL | 0 refills | Status: AC
  Filled 2023-03-09: qty 90, 90d supply, fill #0

## 2023-03-10 ENCOUNTER — Other Ambulatory Visit (HOSPITAL_COMMUNITY): Payer: Self-pay

## 2023-03-10 ENCOUNTER — Other Ambulatory Visit: Payer: Self-pay

## 2023-03-11 DIAGNOSIS — R399 Unspecified symptoms and signs involving the genitourinary system: Secondary | ICD-10-CM | POA: Diagnosis not present

## 2023-03-11 DIAGNOSIS — N3281 Overactive bladder: Secondary | ICD-10-CM | POA: Diagnosis not present

## 2023-03-11 DIAGNOSIS — Z6841 Body Mass Index (BMI) 40.0 and over, adult: Secondary | ICD-10-CM | POA: Diagnosis not present

## 2023-03-14 ENCOUNTER — Other Ambulatory Visit (HOSPITAL_COMMUNITY): Payer: Self-pay

## 2023-03-17 DIAGNOSIS — M51369 Other intervertebral disc degeneration, lumbar region without mention of lumbar back pain or lower extremity pain: Secondary | ICD-10-CM | POA: Diagnosis not present

## 2023-03-17 DIAGNOSIS — G894 Chronic pain syndrome: Secondary | ICD-10-CM | POA: Diagnosis not present

## 2023-03-19 ENCOUNTER — Other Ambulatory Visit (HOSPITAL_COMMUNITY): Payer: Self-pay

## 2023-03-19 DIAGNOSIS — M545 Low back pain, unspecified: Secondary | ICD-10-CM | POA: Diagnosis not present

## 2023-03-19 DIAGNOSIS — G894 Chronic pain syndrome: Secondary | ICD-10-CM | POA: Diagnosis not present

## 2023-03-19 DIAGNOSIS — F1120 Opioid dependence, uncomplicated: Secondary | ICD-10-CM | POA: Diagnosis not present

## 2023-03-19 DIAGNOSIS — Z6841 Body Mass Index (BMI) 40.0 and over, adult: Secondary | ICD-10-CM | POA: Diagnosis not present

## 2023-03-19 DIAGNOSIS — Z79899 Other long term (current) drug therapy: Secondary | ICD-10-CM | POA: Diagnosis not present

## 2023-03-19 DIAGNOSIS — M51369 Other intervertebral disc degeneration, lumbar region without mention of lumbar back pain or lower extremity pain: Secondary | ICD-10-CM | POA: Diagnosis not present

## 2023-03-19 MED ORDER — GABAPENTIN 100 MG PO CAPS
100.0000 mg | ORAL_CAPSULE | Freq: Three times a day (TID) | ORAL | 0 refills | Status: DC
  Filled 2023-03-19: qty 90, 30d supply, fill #0

## 2023-03-19 MED ORDER — OXYCODONE-ACETAMINOPHEN 10-325 MG PO TABS
1.0000 | ORAL_TABLET | Freq: Three times a day (TID) | ORAL | 0 refills | Status: AC | PRN
  Filled 2023-03-19: qty 90, 30d supply, fill #0

## 2023-03-19 MED ORDER — METHOCARBAMOL 750 MG PO TABS
1500.0000 mg | ORAL_TABLET | Freq: Three times a day (TID) | ORAL | 0 refills | Status: AC
  Filled 2023-03-19: qty 84, 14d supply, fill #0

## 2023-03-24 ENCOUNTER — Other Ambulatory Visit (HOSPITAL_COMMUNITY): Payer: Self-pay

## 2023-04-11 ENCOUNTER — Other Ambulatory Visit (HOSPITAL_COMMUNITY): Payer: Self-pay

## 2023-04-12 ENCOUNTER — Other Ambulatory Visit: Payer: Self-pay

## 2023-04-12 ENCOUNTER — Other Ambulatory Visit (HOSPITAL_COMMUNITY): Payer: Self-pay

## 2023-04-12 DIAGNOSIS — E1169 Type 2 diabetes mellitus with other specified complication: Secondary | ICD-10-CM | POA: Diagnosis not present

## 2023-04-12 DIAGNOSIS — E119 Type 2 diabetes mellitus without complications: Secondary | ICD-10-CM | POA: Diagnosis not present

## 2023-04-12 DIAGNOSIS — E7800 Pure hypercholesterolemia, unspecified: Secondary | ICD-10-CM | POA: Diagnosis not present

## 2023-04-12 DIAGNOSIS — Z6841 Body Mass Index (BMI) 40.0 and over, adult: Secondary | ICD-10-CM | POA: Diagnosis not present

## 2023-04-12 DIAGNOSIS — L84 Corns and callosities: Secondary | ICD-10-CM | POA: Diagnosis not present

## 2023-04-14 ENCOUNTER — Other Ambulatory Visit (HOSPITAL_COMMUNITY): Payer: Self-pay

## 2023-04-17 DIAGNOSIS — M51369 Other intervertebral disc degeneration, lumbar region without mention of lumbar back pain or lower extremity pain: Secondary | ICD-10-CM | POA: Diagnosis not present

## 2023-04-17 DIAGNOSIS — G894 Chronic pain syndrome: Secondary | ICD-10-CM | POA: Diagnosis not present

## 2023-04-19 ENCOUNTER — Other Ambulatory Visit (HOSPITAL_COMMUNITY): Payer: Self-pay

## 2023-04-19 DIAGNOSIS — M51369 Other intervertebral disc degeneration, lumbar region without mention of lumbar back pain or lower extremity pain: Secondary | ICD-10-CM | POA: Diagnosis not present

## 2023-04-19 DIAGNOSIS — Z79899 Other long term (current) drug therapy: Secondary | ICD-10-CM | POA: Diagnosis not present

## 2023-04-19 DIAGNOSIS — G894 Chronic pain syndrome: Secondary | ICD-10-CM | POA: Diagnosis not present

## 2023-04-19 DIAGNOSIS — M545 Low back pain, unspecified: Secondary | ICD-10-CM | POA: Diagnosis not present

## 2023-04-19 DIAGNOSIS — Z6841 Body Mass Index (BMI) 40.0 and over, adult: Secondary | ICD-10-CM | POA: Diagnosis not present

## 2023-04-19 MED ORDER — METHOCARBAMOL 500 MG PO TABS
1000.0000 mg | ORAL_TABLET | Freq: Four times a day (QID) | ORAL | 0 refills | Status: DC | PRN
  Filled 2023-04-19: qty 240, 30d supply, fill #0

## 2023-04-19 MED ORDER — GABAPENTIN 100 MG PO CAPS
100.0000 mg | ORAL_CAPSULE | Freq: Three times a day (TID) | ORAL | 0 refills | Status: DC
  Filled 2023-04-19: qty 90, 30d supply, fill #0

## 2023-04-19 MED ORDER — OXYCODONE-ACETAMINOPHEN 10-325 MG PO TABS
1.0000 | ORAL_TABLET | Freq: Three times a day (TID) | ORAL | 0 refills | Status: DC | PRN
  Filled 2023-04-19: qty 90, 30d supply, fill #0

## 2023-04-26 ENCOUNTER — Encounter: Payer: Self-pay | Admitting: Podiatry

## 2023-04-26 ENCOUNTER — Ambulatory Visit: Payer: Medicare HMO | Admitting: Podiatry

## 2023-04-26 DIAGNOSIS — Z79899 Other long term (current) drug therapy: Secondary | ICD-10-CM | POA: Diagnosis not present

## 2023-04-26 DIAGNOSIS — L853 Xerosis cutis: Secondary | ICD-10-CM | POA: Diagnosis not present

## 2023-04-26 DIAGNOSIS — E1142 Type 2 diabetes mellitus with diabetic polyneuropathy: Secondary | ICD-10-CM

## 2023-04-26 MED ORDER — AMMONIUM LACTATE 12 % EX CREA: 1.0000 | TOPICAL_CREAM | CUTANEOUS | 0 refills | Status: AC | PRN
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
## 2023-05-12 ENCOUNTER — Other Ambulatory Visit (HOSPITAL_COMMUNITY): Payer: Self-pay

## 2023-05-17 DIAGNOSIS — M51369 Other intervertebral disc degeneration, lumbar region without mention of lumbar back pain or lower extremity pain: Secondary | ICD-10-CM | POA: Diagnosis not present

## 2023-05-17 DIAGNOSIS — G894 Chronic pain syndrome: Secondary | ICD-10-CM | POA: Diagnosis not present

## 2023-05-18 ENCOUNTER — Other Ambulatory Visit (HOSPITAL_COMMUNITY): Payer: Self-pay

## 2023-05-18 DIAGNOSIS — F1120 Opioid dependence, uncomplicated: Secondary | ICD-10-CM | POA: Diagnosis not present

## 2023-05-18 DIAGNOSIS — Z79899 Other long term (current) drug therapy: Secondary | ICD-10-CM | POA: Diagnosis not present

## 2023-05-18 DIAGNOSIS — M545 Low back pain, unspecified: Secondary | ICD-10-CM | POA: Diagnosis not present

## 2023-05-18 DIAGNOSIS — Z6841 Body Mass Index (BMI) 40.0 and over, adult: Secondary | ICD-10-CM | POA: Diagnosis not present

## 2023-05-18 DIAGNOSIS — G894 Chronic pain syndrome: Secondary | ICD-10-CM | POA: Diagnosis not present

## 2023-05-18 DIAGNOSIS — M51369 Other intervertebral disc degeneration, lumbar region without mention of lumbar back pain or lower extremity pain: Secondary | ICD-10-CM | POA: Diagnosis not present

## 2023-05-18 MED ORDER — OXYCODONE-ACETAMINOPHEN 10-325 MG PO TABS
1.0000 | ORAL_TABLET | Freq: Three times a day (TID) | ORAL | 0 refills | Status: DC | PRN
  Filled 2023-05-18: qty 90, 30d supply, fill #0

## 2023-05-18 MED ORDER — METHOCARBAMOL 500 MG PO TABS
1000.0000 mg | ORAL_TABLET | Freq: Four times a day (QID) | ORAL | 0 refills | Status: AC | PRN
  Filled 2023-05-18: qty 240, 30d supply, fill #0

## 2023-05-18 MED ORDER — GABAPENTIN 100 MG PO CAPS
100.0000 mg | ORAL_CAPSULE | Freq: Three times a day (TID) | ORAL | 0 refills | Status: AC
  Filled 2023-05-18: qty 90, 30d supply, fill #0

## 2023-05-21 DIAGNOSIS — F413 Other mixed anxiety disorders: Secondary | ICD-10-CM | POA: Diagnosis not present

## 2023-05-21 DIAGNOSIS — F3162 Bipolar disorder, current episode mixed, moderate: Secondary | ICD-10-CM | POA: Diagnosis not present

## 2023-05-21 DIAGNOSIS — F4322 Adjustment disorder with anxiety: Secondary | ICD-10-CM | POA: Diagnosis not present

## 2023-06-18 ENCOUNTER — Other Ambulatory Visit (HOSPITAL_COMMUNITY): Payer: Self-pay

## 2023-06-18 MED ORDER — OXYCODONE-ACETAMINOPHEN 10-325 MG PO TABS
1.0000 | ORAL_TABLET | Freq: Three times a day (TID) | ORAL | 0 refills | Status: DC | PRN
  Filled 2023-06-18: qty 90, 30d supply, fill #0

## 2023-06-18 MED ORDER — GABAPENTIN 300 MG PO CAPS
300.0000 mg | ORAL_CAPSULE | Freq: Three times a day (TID) | ORAL | 0 refills | Status: AC
  Filled 2023-06-18 – 2023-06-19 (×2): qty 90, 30d supply, fill #0

## 2023-06-21 ENCOUNTER — Other Ambulatory Visit (HOSPITAL_COMMUNITY): Payer: Self-pay

## 2023-07-19 ENCOUNTER — Other Ambulatory Visit (HOSPITAL_COMMUNITY): Payer: Self-pay

## 2023-07-19 MED ORDER — GABAPENTIN 300 MG PO CAPS
300.0000 mg | ORAL_CAPSULE | Freq: Three times a day (TID) | ORAL | 0 refills | Status: AC
  Filled 2023-07-19: qty 90, 30d supply, fill #0

## 2023-07-19 MED ORDER — OXYCODONE-ACETAMINOPHEN 10-325 MG PO TABS
1.0000 | ORAL_TABLET | Freq: Three times a day (TID) | ORAL | 0 refills | Status: DC | PRN
  Filled 2023-07-19: qty 90, 30d supply, fill #0

## 2023-08-16 ENCOUNTER — Other Ambulatory Visit (HOSPITAL_COMMUNITY): Payer: Self-pay

## 2023-08-16 MED ORDER — OXYCODONE-ACETAMINOPHEN 10-325 MG PO TABS
1.0000 | ORAL_TABLET | Freq: Three times a day (TID) | ORAL | 0 refills | Status: DC | PRN
  Filled 2023-08-16 – 2023-08-18 (×2): qty 90, 30d supply, fill #0

## 2023-08-16 MED ORDER — NALOXONE HCL 4 MG/0.1ML NA LIQD
1.0000 | NASAL | 1 refills | Status: AC | PRN
Start: 2023-08-16 — End: ?
  Filled 2023-08-16 – 2023-08-18 (×2): qty 2, 1d supply, fill #0

## 2023-08-16 MED ORDER — GABAPENTIN 300 MG PO CAPS
300.0000 mg | ORAL_CAPSULE | Freq: Three times a day (TID) | ORAL | 0 refills | Status: AC
  Filled 2023-08-16 – 2023-08-18 (×2): qty 90, 30d supply, fill #0

## 2023-08-18 ENCOUNTER — Other Ambulatory Visit (HOSPITAL_COMMUNITY): Payer: Self-pay

## 2023-08-18 ENCOUNTER — Other Ambulatory Visit: Payer: Self-pay

## 2023-09-17 ENCOUNTER — Other Ambulatory Visit (HOSPITAL_COMMUNITY): Payer: Self-pay

## 2023-09-17 ENCOUNTER — Other Ambulatory Visit: Payer: Self-pay

## 2023-09-17 MED ORDER — GABAPENTIN 300 MG PO CAPS
300.0000 mg | ORAL_CAPSULE | Freq: Three times a day (TID) | ORAL | 0 refills | Status: AC
  Filled 2023-09-17: qty 90, 30d supply, fill #0

## 2023-09-17 MED ORDER — OXYCODONE-ACETAMINOPHEN 10-325 MG PO TABS
1.0000 | ORAL_TABLET | Freq: Three times a day (TID) | ORAL | 0 refills | Status: DC | PRN
  Filled 2023-09-17: qty 90, 30d supply, fill #0

## 2023-09-17 MED ORDER — NALOXONE HCL 4 MG/0.1ML NA LIQD
1.0000 | NASAL | 1 refills | Status: AC | PRN
  Filled 2023-09-17: qty 2, 1d supply, fill #0

## 2023-10-15 ENCOUNTER — Other Ambulatory Visit: Payer: Self-pay

## 2023-10-15 ENCOUNTER — Other Ambulatory Visit (HOSPITAL_COMMUNITY): Payer: Self-pay

## 2023-10-15 MED ORDER — NALOXONE HCL 4 MG/0.1ML NA LIQD
1.0000 | NASAL | 1 refills | Status: AC | PRN
  Filled 2023-10-15: qty 2, 1d supply, fill #0

## 2023-10-15 MED ORDER — OXYCODONE-ACETAMINOPHEN 10-325 MG PO TABS
1.0000 | ORAL_TABLET | Freq: Three times a day (TID) | ORAL | 0 refills | Status: DC | PRN
Start: 2023-10-15 — End: 2023-11-16
  Filled 2023-10-15: qty 90, 30d supply, fill #0

## 2023-10-15 MED ORDER — GABAPENTIN 300 MG PO CAPS
300.0000 mg | ORAL_CAPSULE | Freq: Three times a day (TID) | ORAL | 0 refills | Status: AC
  Filled 2023-10-15: qty 90, 30d supply, fill #0

## 2023-11-16 ENCOUNTER — Other Ambulatory Visit (HOSPITAL_COMMUNITY): Payer: Self-pay

## 2023-11-16 MED ORDER — GABAPENTIN 300 MG PO CAPS
300.0000 mg | ORAL_CAPSULE | Freq: Three times a day (TID) | ORAL | 0 refills | Status: AC
  Filled 2023-11-16: qty 90, 30d supply, fill #0

## 2023-11-16 MED ORDER — OXYCODONE-ACETAMINOPHEN 10-325 MG PO TABS
1.0000 | ORAL_TABLET | Freq: Three times a day (TID) | ORAL | 0 refills | Status: DC | PRN
  Filled 2023-11-16: qty 90, 30d supply, fill #0

## 2023-12-15 ENCOUNTER — Other Ambulatory Visit (HOSPITAL_COMMUNITY): Payer: Self-pay

## 2023-12-15 MED ORDER — OXYCODONE-ACETAMINOPHEN 10-325 MG PO TABS
1.0000 | ORAL_TABLET | Freq: Three times a day (TID) | ORAL | 0 refills | Status: DC | PRN
  Filled 2023-12-15: qty 90, 30d supply, fill #0

## 2024-01-14 ENCOUNTER — Other Ambulatory Visit (HOSPITAL_COMMUNITY): Payer: Self-pay

## 2024-01-14 MED ORDER — OXYCODONE-ACETAMINOPHEN 10-325 MG PO TABS
1.0000 | ORAL_TABLET | Freq: Three times a day (TID) | ORAL | 0 refills | Status: DC | PRN
  Filled 2024-01-14: qty 90, 30d supply, fill #0

## 2024-02-04 ENCOUNTER — Other Ambulatory Visit (HOSPITAL_COMMUNITY): Payer: Self-pay

## 2024-02-04 MED ORDER — SOLIFENACIN SUCCINATE 10 MG PO TABS
10.0000 mg | ORAL_TABLET | Freq: Every day | ORAL | 3 refills | Status: DC
  Filled 2024-02-04: qty 90, 90d supply, fill #0
  Filled 2024-04-29: qty 90, 90d supply, fill #1

## 2024-02-04 MED ORDER — SUMATRIPTAN SUCCINATE 100 MG PO TABS
100.0000 mg | ORAL_TABLET | Freq: Every day | ORAL | 11 refills | Status: AC
  Filled 2024-02-04: qty 12, 12d supply, fill #0
  Filled 2024-02-04: qty 9, 23d supply, fill #0

## 2024-02-14 ENCOUNTER — Other Ambulatory Visit (HOSPITAL_COMMUNITY): Payer: Self-pay

## 2024-02-14 MED ORDER — OXYCODONE-ACETAMINOPHEN 10-325 MG PO TABS
1.0000 | ORAL_TABLET | Freq: Three times a day (TID) | ORAL | 0 refills | Status: DC | PRN
  Filled 2024-02-14: qty 90, 30d supply, fill #0

## 2024-03-14 ENCOUNTER — Other Ambulatory Visit (HOSPITAL_COMMUNITY): Payer: Self-pay

## 2024-03-14 MED ORDER — FLUCONAZOLE 150 MG PO TABS
150.0000 mg | ORAL_TABLET | ORAL | 0 refills | Status: AC
  Filled 2024-03-14: qty 3, 9d supply, fill #0

## 2024-03-15 ENCOUNTER — Other Ambulatory Visit (HOSPITAL_COMMUNITY): Payer: Self-pay

## 2024-03-15 MED ORDER — VITAMIN D (ERGOCALCIFEROL) 1.25 MG (50000 UNIT) PO CAPS
50000.0000 [IU] | ORAL_CAPSULE | ORAL | 5 refills | Status: AC
  Filled 2024-03-15: qty 4, 28d supply, fill #0

## 2024-03-15 MED ORDER — OXYCODONE-ACETAMINOPHEN 10-325 MG PO TABS
1.0000 | ORAL_TABLET | Freq: Three times a day (TID) | ORAL | 0 refills | Status: DC | PRN
  Filled 2024-03-15: qty 90, 30d supply, fill #0

## 2024-03-28 ENCOUNTER — Other Ambulatory Visit (HOSPITAL_COMMUNITY): Payer: Self-pay | Admitting: Student

## 2024-03-28 DIAGNOSIS — R1310 Dysphagia, unspecified: Secondary | ICD-10-CM

## 2024-04-13 ENCOUNTER — Other Ambulatory Visit (HOSPITAL_COMMUNITY): Payer: Self-pay

## 2024-04-13 MED ORDER — OXYCODONE-ACETAMINOPHEN 10-325 MG PO TABS
1.0000 | ORAL_TABLET | Freq: Three times a day (TID) | ORAL | 0 refills | Status: DC | PRN
  Filled 2024-04-13 – 2024-04-14 (×2): qty 90, 30d supply, fill #0

## 2024-04-13 NOTE — Progress Notes (Signed)
 Pain Sacroiliac Joint Injection Left; Fluoroscopy  Performed by: Toribio Fairy Badder, MD Authorized by: Sherlean Jenkins New, PA-C   Procedure:  Pain Sacroiliac Joint Injection Procedure Laterality:  Left Procedure Guidance:  Fluoroscopy   [facility]  PATIENT NAME: Nancy [name] PATIENT MRN: [id number] DATE OF SURGERY: 04/13/2024  TITLE OF PROCEDURE:  1) Sacroiliac Joint Injection  INDICATION AND PHYSICAL EXAM: Pietra L Hobby's pain is sacroiliac joint mediated disease that is aggravated by FABER eliciting posterior pain.  The patient demonstrates Fortin's point tenderness, SI joint compression test positive for posterior pain, and thigh thrust test positive for posterior pain all consistent with sacroiliac joint mediated pain. The patient has been suffering from chronic low back pain suspected to be originating from the SI joint for a minimum of three months and has undergone at least 4 weeks of conservative therapy prior to moving forward with today's injection. These findings were confirmed with the patient today.   SIDE: Left  PREOPERATIVE DIAGNOSES: Sacroiliac Joint Pain, Buttock Pain  POSTOPERATIVE DIAGNOSES:  Same  LOCATION: [facility]  SURGEON: Toribio PARAS Bintrim MD  ASSISTANTS: None  ANESTHESIA: Local  DESCRIPTION OF OPERATIVE PROCEDURE: Prior to initiation of the procedure, informed consent was obtained. The patient was made aware of the possibility of pain during the procedure and/or pain which is not relieved following the procedure. The patient was brought to the procedure room and positioned to comfort and optimal positioning for the procedure.  A time-out was conducted with all members of the care team as per [facility] protocol.  Standard ASA monitors were placed and standard prep and drape were performed observing sterile technique.  After identifying the sacrum  under AP fluoroscopy, the location of the appropriate sacroiliac joint was identified. Fluoroscopy was adjusted with oblique rotation and caudal tilt as needed to obtain a optimal view of the joint prior to injection. The planned site of injection was marked at the skin with a sterile marking pen. Then, the skin was anesthetized at the marked site using 1% lidocaine  administered via a 1.5 inch 25G needle, then a 22G needle was guided under intermittent fluoroscopy into the joint. Adequate needle depth and position was confirmed with the use of oblique and/or lateral fluoroscopy. Then, 0.5cc of air contrast was then injected into the joint demonstrating appropriate spread via air pneumogram. After negative aspiration for blood, air, or CSF, the needle was injected with a 1.5cc solution containing 0.5% bupivacaine  and 40mg  Kenalog , then withdrawn. The patient did not experience lingering pain or paresthesia with the injection.   The surgical site preparation was washed off of the patient. Hemostasis was appreciated and bandages were applied as needed. The patient tolerated the procedure well and was escorted to the recovery area. The patient was monitored for an appropriate amount of time and standard discharge instructions were reviewed.  The patient knows how to contact the clinic should she have any questions or problems, and she was discharged in stable condition without evidence of a motor block.  Preprocedural pain score was  6/10 Post procedural pain score was 4/10  COMPLICATIONS: None  EBL: None  PLAN: Return to the clinic for postprocedure follow up in 4-6 weeks. If this was a diagnostic injection, consider repeat therapeutic injection if 75% pain relief was obtained from the effect of the local anesthetic. If this was a therapeutic injection, consider repeat therapeutic injection if a minimum of 50% pain  relief is obtained for at least 3 months upon completion of the injection.  Toribio PARAS Bintrim  MD

## 2024-04-14 ENCOUNTER — Ambulatory Visit (HOSPITAL_COMMUNITY)
Admission: RE | Admit: 2024-04-14 | Discharge: 2024-04-14 | Disposition: A | Discharge status: Home / Self Care | Source: Ambulatory Visit | Attending: Student | Admitting: Student

## 2024-04-14 ENCOUNTER — Other Ambulatory Visit (HOSPITAL_COMMUNITY): Payer: Self-pay

## 2024-04-14 DIAGNOSIS — R1310 Dysphagia, unspecified: Secondary | ICD-10-CM | POA: Diagnosis present

## 2024-04-29 ENCOUNTER — Other Ambulatory Visit (HOSPITAL_COMMUNITY): Payer: Self-pay

## 2024-05-01 ENCOUNTER — Other Ambulatory Visit: Payer: Self-pay

## 2024-05-01 ENCOUNTER — Other Ambulatory Visit (HOSPITAL_COMMUNITY): Payer: Self-pay

## 2024-05-10 ENCOUNTER — Ambulatory Visit: Admitting: Podiatry

## 2024-05-10 ENCOUNTER — Encounter: Payer: Self-pay | Admitting: Podiatry

## 2024-05-10 DIAGNOSIS — L853 Xerosis cutis: Secondary | ICD-10-CM

## 2024-05-10 DIAGNOSIS — E1142 Type 2 diabetes mellitus with diabetic polyneuropathy: Secondary | ICD-10-CM
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
## 2024-05-12 ENCOUNTER — Other Ambulatory Visit (HOSPITAL_COMMUNITY): Payer: Self-pay

## 2024-05-12 MED ORDER — OXYCODONE-ACETAMINOPHEN 10-325 MG PO TABS
1.0000 | ORAL_TABLET | Freq: Three times a day (TID) | ORAL | 0 refills | Status: DC | PRN
  Filled 2024-05-12: qty 90, 30d supply, fill #0

## 2024-06-09 ENCOUNTER — Other Ambulatory Visit (HOSPITAL_COMMUNITY): Payer: Self-pay

## 2024-06-09 MED ORDER — TRIAMCINOLONE ACETONIDE 0.1 % MT PSTE
1.0000 | PASTE | Freq: Two times a day (BID) | OROMUCOSAL | 1 refills | Status: AC
  Filled 2024-06-09: qty 5, 30d supply, fill #0

## 2024-06-09 MED ORDER — FLUCONAZOLE 200 MG PO TABS
ORAL_TABLET | ORAL | 0 refills | Status: AC
  Filled 2024-06-09: qty 22, 21d supply, fill #0

## 2024-06-12 ENCOUNTER — Other Ambulatory Visit (HOSPITAL_COMMUNITY): Payer: Self-pay

## 2024-06-12 MED ORDER — NALOXONE HCL 4 MG/0.1ML NA LIQD
1.0000 | NASAL | 1 refills | Status: AC | PRN
  Filled 2024-06-12: qty 2, 1d supply, fill #0

## 2024-06-12 MED ORDER — OXYCODONE-ACETAMINOPHEN 10-325 MG PO TABS
1.0000 | ORAL_TABLET | Freq: Three times a day (TID) | ORAL | 0 refills | Status: AC | PRN
  Filled 2024-06-12: qty 90, 30d supply, fill #0

## 2024-06-28 ENCOUNTER — Other Ambulatory Visit (HOSPITAL_COMMUNITY): Payer: Self-pay

## 2024-06-28 MED ORDER — METFORMIN HCL 1000 MG PO TABS
1000.0000 mg | ORAL_TABLET | Freq: Two times a day (BID) | ORAL | 3 refills | Status: AC
  Filled 2024-06-28: qty 180, 90d supply, fill #0

## 2024-06-28 MED ORDER — PREDNISONE 20 MG PO TABS
ORAL_TABLET | ORAL | 0 refills | Status: AC
  Filled 2024-06-28: qty 12, 6d supply, fill #0

## 2024-06-28 MED ORDER — MOUNJARO 2.5 MG/0.5ML ~~LOC~~ SOAJ
2.5000 mg | SUBCUTANEOUS | 0 refills | Status: AC
  Filled 2024-06-28: qty 2, 28d supply, fill #0
